# Patient Record
Sex: Female | Born: 1975
Health system: Southern US, Community
[De-identification: ages and names within clinical notes are randomized; demographics above are authoritative.]

## PROBLEM LIST (undated history)

## (undated) DIAGNOSIS — R55 Syncope and collapse: Secondary | ICD-10-CM

## (undated) DIAGNOSIS — K219 Gastro-esophageal reflux disease without esophagitis: Secondary | ICD-10-CM

## (undated) DIAGNOSIS — R51 Headache: Secondary | ICD-10-CM

## (undated) DIAGNOSIS — Z9889 Other specified postprocedural states: Secondary | ICD-10-CM

## (undated) DIAGNOSIS — S83006A Unspecified dislocation of unspecified patella, initial encounter: Secondary | ICD-10-CM

## (undated) DIAGNOSIS — R112 Nausea with vomiting, unspecified: Secondary | ICD-10-CM

## (undated) DIAGNOSIS — J45990 Exercise induced bronchospasm: Secondary | ICD-10-CM

## (undated) DIAGNOSIS — M329 Systemic lupus erythematosus, unspecified: Secondary | ICD-10-CM

## (undated) DIAGNOSIS — D649 Anemia, unspecified: Secondary | ICD-10-CM

## (undated) DIAGNOSIS — E785 Hyperlipidemia, unspecified: Secondary | ICD-10-CM

## (undated) DIAGNOSIS — T7840XA Allergy, unspecified, initial encounter: Secondary | ICD-10-CM

## (undated) HISTORY — DX: Exercise induced bronchospasm: J45.990

## (undated) HISTORY — DX: Gastro-esophageal reflux disease without esophagitis: K21.9

## (undated) HISTORY — DX: Hyperlipidemia, unspecified: E78.5

## (undated) HISTORY — DX: Unspecified dislocation of unspecified patella, initial encounter: S83.006A

## (undated) HISTORY — DX: Anemia, unspecified: D64.9

## (undated) HISTORY — DX: Allergy, unspecified, initial encounter: T78.40XA

## (undated) HISTORY — PX: ABDOMINAL HYSTERECTOMY: SHX81

## (undated) HISTORY — DX: Syncope and collapse: R55

---
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
## 1996-01-30 HISTORY — PX: TUMOR REMOVAL: SHX12

## 1997-01-29 DIAGNOSIS — F909 Attention-deficit hyperactivity disorder, unspecified type: Secondary | ICD-10-CM

## 1997-01-29 DIAGNOSIS — F419 Anxiety disorder, unspecified: Secondary | ICD-10-CM

## 1997-01-29 HISTORY — DX: Attention-deficit hyperactivity disorder, unspecified type: F90.9

## 1997-01-29 HISTORY — DX: Anxiety disorder, unspecified: F41.9

## 1998-01-29 DIAGNOSIS — R51 Headache: Secondary | ICD-10-CM

## 1998-01-29 HISTORY — DX: Headache: R51

## 2001-01-29 DIAGNOSIS — IMO0002 Reserved for concepts with insufficient information to code with codable children: Secondary | ICD-10-CM

## 2001-01-29 DIAGNOSIS — M329 Systemic lupus erythematosus, unspecified: Secondary | ICD-10-CM

## 2001-01-29 DIAGNOSIS — K529 Noninfective gastroenteritis and colitis, unspecified: Secondary | ICD-10-CM

## 2001-01-29 HISTORY — DX: Reserved for concepts with insufficient information to code with codable children: IMO0002

## 2001-01-29 HISTORY — DX: Noninfective gastroenteritis and colitis, unspecified: K52.9

## 2001-01-29 HISTORY — DX: Systemic lupus erythematosus, unspecified: M32.9

## 2001-09-15 ENCOUNTER — Encounter: Payer: Self-pay | Admitting: Obstetrics

## 2001-09-15 ENCOUNTER — Ambulatory Visit (HOSPITAL_COMMUNITY): Admission: RE | Admit: 2001-09-15 | Discharge: 2001-09-15 | Payer: Self-pay | Admitting: Obstetrics

## 2001-09-19 ENCOUNTER — Ambulatory Visit (HOSPITAL_COMMUNITY): Admission: RE | Admit: 2001-09-19 | Discharge: 2001-09-19 | Payer: Self-pay | Admitting: Obstetrics

## 2002-01-29 DIAGNOSIS — K589 Irritable bowel syndrome without diarrhea: Secondary | ICD-10-CM

## 2002-01-29 HISTORY — PX: TUBAL LIGATION: SHX77

## 2002-01-29 HISTORY — DX: Irritable bowel syndrome, unspecified: K58.9

## 2002-05-22 ENCOUNTER — Ambulatory Visit (HOSPITAL_COMMUNITY): Admission: RE | Admit: 2002-05-22 | Discharge: 2002-05-22 | Payer: Self-pay | Admitting: Gastroenterology

## 2002-06-12 ENCOUNTER — Ambulatory Visit (HOSPITAL_COMMUNITY): Admission: RE | Admit: 2002-06-12 | Discharge: 2002-06-12 | Payer: Self-pay | Admitting: Obstetrics & Gynecology

## 2002-06-12 ENCOUNTER — Encounter: Payer: Self-pay | Admitting: Obstetrics & Gynecology

## 2002-10-16 ENCOUNTER — Ambulatory Visit (HOSPITAL_COMMUNITY): Admission: RE | Admit: 2002-10-16 | Discharge: 2002-10-16 | Payer: Self-pay | Admitting: Obstetrics & Gynecology

## 2002-10-16 ENCOUNTER — Encounter: Payer: Self-pay | Admitting: Obstetrics & Gynecology

## 2002-12-17 ENCOUNTER — Inpatient Hospital Stay (HOSPITAL_COMMUNITY): Admission: AD | Admit: 2002-12-17 | Discharge: 2002-12-17 | Payer: Self-pay | Admitting: Obstetrics

## 2002-12-21 ENCOUNTER — Inpatient Hospital Stay (HOSPITAL_COMMUNITY): Admission: AD | Admit: 2002-12-21 | Discharge: 2002-12-23 | Payer: Self-pay | Admitting: Obstetrics

## 2003-01-06 ENCOUNTER — Ambulatory Visit (HOSPITAL_COMMUNITY): Admission: RE | Admit: 2003-01-06 | Discharge: 2003-01-06 | Payer: Self-pay | Admitting: Obstetrics & Gynecology

## 2003-01-12 ENCOUNTER — Ambulatory Visit (HOSPITAL_COMMUNITY): Admission: RE | Admit: 2003-01-12 | Discharge: 2003-01-12 | Payer: Self-pay | Admitting: Obstetrics & Gynecology

## 2003-01-17 ENCOUNTER — Inpatient Hospital Stay (HOSPITAL_COMMUNITY): Admission: AD | Admit: 2003-01-17 | Discharge: 2003-01-19 | Payer: Self-pay | Admitting: Obstetrics

## 2005-01-29 HISTORY — PX: DILATION AND CURETTAGE OF UTERUS: SHX78

## 2005-09-14 ENCOUNTER — Ambulatory Visit (HOSPITAL_COMMUNITY): Admission: RE | Admit: 2005-09-14 | Discharge: 2005-09-14 | Payer: Self-pay | Admitting: Obstetrics and Gynecology

## 2010-03-30 ENCOUNTER — Ambulatory Visit: Payer: Self-pay | Admitting: Sports Medicine

## 2010-04-04 ENCOUNTER — Ambulatory Visit: Payer: Self-pay | Admitting: Sports Medicine

## 2010-04-09 ENCOUNTER — Ambulatory Visit (INDEPENDENT_AMBULATORY_CARE_PROVIDER_SITE_OTHER)

## 2010-04-09 ENCOUNTER — Inpatient Hospital Stay (INDEPENDENT_AMBULATORY_CARE_PROVIDER_SITE_OTHER)
Admission: RE | Admit: 2010-04-09 | Discharge: 2010-04-09 | Disposition: A | Discharge status: Home / Self Care | Source: Ambulatory Visit | Attending: Emergency Medicine | Admitting: Emergency Medicine

## 2010-04-09 DIAGNOSIS — S81809A Unspecified open wound, unspecified lower leg, initial encounter: Secondary | ICD-10-CM

## 2010-04-09 DIAGNOSIS — S81009A Unspecified open wound, unspecified knee, initial encounter: Secondary | ICD-10-CM

## 2010-04-09 DIAGNOSIS — S91009A Unspecified open wound, unspecified ankle, initial encounter: Secondary | ICD-10-CM

## 2010-04-09 DIAGNOSIS — S8010XA Contusion of unspecified lower leg, initial encounter: Secondary | ICD-10-CM

## 2010-04-20 ENCOUNTER — Ambulatory Visit (INDEPENDENT_AMBULATORY_CARE_PROVIDER_SITE_OTHER): Admitting: Sports Medicine

## 2010-04-20 ENCOUNTER — Encounter: Payer: Self-pay | Admitting: Sports Medicine

## 2010-04-20 ENCOUNTER — Encounter: Payer: Self-pay | Admitting: Family Medicine

## 2010-04-20 DIAGNOSIS — M222X9 Patellofemoral disorders, unspecified knee: Secondary | ICD-10-CM | POA: Insufficient documentation

## 2010-04-20 DIAGNOSIS — R269 Unspecified abnormalities of gait and mobility: Secondary | ICD-10-CM

## 2010-04-20 DIAGNOSIS — M25562 Pain in left knee: Secondary | ICD-10-CM | POA: Insufficient documentation

## 2010-04-20 DIAGNOSIS — M25569 Pain in unspecified knee: Secondary | ICD-10-CM

## 2010-04-20 NOTE — Assessment & Plan Note (Signed)
-   Fitted with sports insoles today which were comfortable in office today & gait was more neutral - Wear insoles in all shoes

## 2010-04-20 NOTE — Assessment & Plan Note (Signed)
-   Fitted with sports insoles to correct gait abnormality which should help - Fitted with body helix slingshot which was comfortable - wear with activity - Start knee HEP - quad sets, straight leg raises, short-arch leg extensions - Start hip HEP - hip adduction/abduction exercises, standing rotations - Start using stationary bike - May resume running, but start slow - no more than 2-miles/day and increase gradually - f/u 6-weeks, call with any questions or concerns

## 2010-04-20 NOTE — Assessment & Plan Note (Signed)
-   secondary to patellofemoral syndrome - plan as noted above
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
## 2010-05-30 ENCOUNTER — Ambulatory Visit (INDEPENDENT_AMBULATORY_CARE_PROVIDER_SITE_OTHER): Admitting: Sports Medicine

## 2010-05-30 DIAGNOSIS — M25569 Pain in unspecified knee: Secondary | ICD-10-CM

## 2010-05-30 DIAGNOSIS — M25562 Pain in left knee: Secondary | ICD-10-CM

## 2010-05-30 DIAGNOSIS — M222X9 Patellofemoral disorders, unspecified knee: Secondary | ICD-10-CM

## 2010-05-30 DIAGNOSIS — R269 Unspecified abnormalities of gait and mobility: Secondary | ICD-10-CM

## 2010-05-30 NOTE — Assessment & Plan Note (Signed)
Mostly resolved and will keep up exercises and strap

## 2010-05-30 NOTE — Patient Instructions (Signed)
Continue biking twice weekly Continue current hip/knee exercises three times weekly 1 set of 15  Do step up, step down, lateral step ups, cross over step ups holding dumbells 10 - 15 reps, 2-3 sets Increase mileage by 1/2 mile weekly - once you have reached 5 miles comfortably - ok to add a long once per week  If you are still significantly improving you do not need to follow up We will see you as needed

## 2010-05-30 NOTE — Assessment & Plan Note (Signed)
Cont using sports insoles as these seem to help  Recheck by [place] Prn

## 2010-05-30 NOTE — Assessment & Plan Note (Signed)
Given more advanced exercises w step ups  gradully ease back into running  Increase by 1/2 mile per run Has been doing 2 to 3 miles without too much pain

## 2010-05-30 NOTE — Progress Notes (Signed)
  Subjective:    Patient ID: [name], female    DOB: 07/01/1975, 35 y.o.   MRN: [id number]  HPI  Pt presents to clinic for f/u of Lt knee patellofemoral pain which she reports has improved 50%.  Rt knee now has no pain.  She now has no pain going downstairs. Has slight discomfort with biking and running but not as intense.  Has been compliant with HEP, using sling shot body helix, and sports insoles.  Biking 2-4 times per week 5-6 miles.  Has not resumed running yet.   Review of Systems     Objective:   Physical Exam     Full ROM no crepitation bilat Good patellar tracking bilat Neg McMurray's bilat VMO definition good bilat Hip abduction strong bilat Strength looks improved to VMOs and to hip abductors  Running gait looks comfortable    Assessment & Plan:

## 2010-07-04 ENCOUNTER — Other Ambulatory Visit: Payer: Self-pay | Admitting: Certified Nurse Midwife

## 2011-01-01 ENCOUNTER — Other Ambulatory Visit: Payer: Self-pay | Admitting: Obstetrics and Gynecology

## 2011-01-02 ENCOUNTER — Encounter (HOSPITAL_COMMUNITY): Payer: Self-pay | Admitting: Pharmacist

## 2011-01-05 ENCOUNTER — Encounter (HOSPITAL_COMMUNITY): Payer: Self-pay

## 2011-01-05 ENCOUNTER — Encounter (HOSPITAL_COMMUNITY)
Admission: RE | Admit: 2011-01-05 | Discharge: 2011-01-05 | Disposition: A | Discharge status: Home / Self Care | Source: Ambulatory Visit | Attending: Obstetrics and Gynecology | Admitting: Obstetrics and Gynecology

## 2011-01-05 HISTORY — DX: Systemic lupus erythematosus, unspecified: M32.9

## 2011-01-05 HISTORY — DX: Nausea with vomiting, unspecified: R11.2

## 2011-01-05 HISTORY — DX: Headache: R51

## 2011-01-05 HISTORY — DX: Nausea with vomiting, unspecified: Z98.890

## 2011-01-05 LAB — CBC
HCT: 38.9 % (ref 36.0–46.0)
Hemoglobin: 13.2 g/dL (ref 12.0–15.0)
MCH: 30.6 pg (ref 26.0–34.0)
MCHC: 33.9 g/dL (ref 30.0–36.0)
MCV: 90.3 fL (ref 78.0–100.0)
Platelets: 242 10*3/uL (ref 150–400)
RBC: 4.31 MIL/uL (ref 3.87–5.11)
RDW: 12 % (ref 11.5–15.5)
WBC: 5 10*3/uL (ref 4.0–10.5)

## 2011-01-05 LAB — SURGICAL PCR SCREEN: MRSA, PCR: NEGATIVE

## 2011-01-05 NOTE — Patient Instructions (Addendum)
   Your procedure is scheduled on: Thursday December 13th  Enter through the Main Entrance of [facility] at:6am Pick up the phone at the desk and dial [phone] and inform us of your arrival.  Please call this number if you have any problems the morning of surgery: [phone]  Remember: Do not eat food after midnight: Wednesday Do not drink clear liquids after: midnight Wednesday Take these medicines the morning of surgery with a SIP OF WATER: none  Do not wear jewelry, make-up, or FINGER nail polish Do not wear lotions, powders, perfumes or deodorant. Do not shave 48 hours prior to surgery. Do not bring valuables to the hospital.  Leave suitcase in the car. After Surgery it may be brought to your room. For patients being admitted to the hospital, checkout time is 11:00am the day of discharge.  Patients discharged on the day of surgery will not be allowed to drive home.     Remember to use your hibiclens as instructed.Please shower with 1/2 bottle the evening before your surgery and the other 1/2 bottle the morning of surgery.

## 2011-01-10 NOTE — H&P (Signed)
NAME:  Current, [name]                  ACCOUNT NO.:  [id number]  MEDICAL RECORD NO.:  [id number]  LOCATION:  PERIO                         FACILITY:  [facility]  PHYSICIAN:  Lenoard Aden, M.D.DATE OF BIRTH:  [birth year]  DATE OF ADMISSION:  08/25/2010 DATE OF DISCHARGE:                             HISTORY & PHYSICAL   CHIEF COMPLAINT:  Menometrorrhagia, status post delayed failed ablation.  HISTORY OF PRESENT ILLNESS:  A 35 year old white female G3, P2, history of vaginal delivery x2, who presents now status post failed endometrial ablation in 2007 and is also status post tubal ligation for definitive therapy.  She has medications to include Maxalt p.r.n. for headache.  Allergies to VICODIN, WELLBUTRIN, and LATEX.  She is a nonsmoker, nondrinker.  She denies domestic or physical violence.  She has a family history of breast cancer, cancer, eye cancer, diabetes, lymphoma, heart disease.  PREGNANCY HISTORY:  As noted.  SURGICAL HISTORY:  Remarkable for endometrial ablation in 2007, tubal ligation in 2004, D and C in 2003, ulnar nerve small tumor removed in 1998, vaginal delivery as noted x2.  PHYSICAL EXAMINATION:  VITAL SIGNS:  Blood pressure 104/62, height of 5 feet 5 inches, and weight of 138 pounds. HEENT:  Normal. NECK:  Supple.  Full range of motion. LUNGS:  Clear. HEART:  Regular rate and rhythm. ABDOMEN:  Soft, scaphoid, nontender.  No CVA tenderness. EXTREMITIES:  There are no cords. NEUROLOGIC:  Nonfocal. SKIN:  Intact. PELVIC:  Reveals a small retroflexed uterus.  IMPRESSION:  Failed endometrial ablation with persistent menometrorrhagia.  She had a history of tubal ligation.  PLAN:  Proceed with da Vinci-assisted total laparoscopic hysterectomy, bilateral salpingectomy.  Risks of anesthesia, infection, bleeding, injury to abdominal organs, need for repair, delayed versus immediate complications to include bowel and bladder was noted.  The patient acknowledges  and wishes to proceed.     Lenoard Aden, M.D.     RJT/MEDQ  D:  01/10/2011  T:  01/10/2011  Job:  [id number]

## 2011-01-11 ENCOUNTER — Encounter (HOSPITAL_COMMUNITY)
Admission: RE | Disposition: A | Discharge status: Home / Self Care | Payer: Self-pay | Source: Ambulatory Visit | Attending: Obstetrics and Gynecology

## 2011-01-11 ENCOUNTER — Encounter (HOSPITAL_COMMUNITY): Payer: Self-pay | Admitting: Anesthesiology

## 2011-01-11 ENCOUNTER — Ambulatory Visit (HOSPITAL_COMMUNITY): Admitting: Anesthesiology

## 2011-01-11 ENCOUNTER — Ambulatory Visit (HOSPITAL_COMMUNITY)
Admission: RE | Admit: 2011-01-11 | Discharge: 2011-01-11 | Disposition: A | Discharge status: Home / Self Care | Source: Ambulatory Visit | Attending: Obstetrics and Gynecology | Admitting: Obstetrics and Gynecology

## 2011-01-11 ENCOUNTER — Other Ambulatory Visit: Payer: Self-pay | Admitting: Obstetrics and Gynecology

## 2011-01-11 ENCOUNTER — Encounter (HOSPITAL_COMMUNITY): Payer: Self-pay | Admitting: *Deleted

## 2011-01-11 DIAGNOSIS — Z01812 Encounter for preprocedural laboratory examination: Secondary | ICD-10-CM | POA: Insufficient documentation

## 2011-01-11 DIAGNOSIS — N838 Other noninflammatory disorders of ovary, fallopian tube and broad ligament: Secondary | ICD-10-CM | POA: Insufficient documentation

## 2011-01-11 DIAGNOSIS — N920 Excessive and frequent menstruation with regular cycle: Secondary | ICD-10-CM | POA: Insufficient documentation

## 2011-01-11 DIAGNOSIS — Z01818 Encounter for other preprocedural examination: Secondary | ICD-10-CM | POA: Insufficient documentation

## 2011-01-11 LAB — HCG, SERUM, QUALITATIVE: Preg, Serum: NEGATIVE

## 2011-01-11 SURGERY — ROBOTIC ASSISTED TOTAL HYSTERECTOMY
Anesthesia: General | Site: Abdomen | Wound class: Clean Contaminated

## 2011-01-11 MED ORDER — ROCURONIUM BROMIDE 50 MG/5ML IV SOLN
INTRAVENOUS | Status: AC
  Filled 2011-01-11: qty 1

## 2011-01-11 MED ORDER — DEXAMETHASONE SODIUM PHOSPHATE 4 MG/ML IJ SOLN
INTRAMUSCULAR | Status: DC | PRN
  Administered 2011-01-11: 10 mg via INTRAVENOUS

## 2011-01-11 MED ORDER — MEPERIDINE HCL 25 MG/ML IJ SOLN
INTRAMUSCULAR | Status: AC
  Filled 2011-01-11: qty 1

## 2011-01-11 MED ORDER — FENTANYL CITRATE 0.05 MG/ML IJ SOLN
INTRAMUSCULAR | Status: AC
  Filled 2011-01-11: qty 2

## 2011-01-11 MED ORDER — GLYCOPYRROLATE 0.2 MG/ML IJ SOLN
INTRAMUSCULAR | Status: DC | PRN
  Administered 2011-01-11: 0.2 mg via INTRAVENOUS
  Administered 2011-01-11: .6 mg via INTRAVENOUS

## 2011-01-11 MED ORDER — LACTATED RINGERS IV SOLN
INTRAVENOUS | Status: DC
  Administered 2011-01-11 (×4): via INTRAVENOUS

## 2011-01-11 MED ORDER — MEPERIDINE HCL 25 MG/ML IJ SOLN
INTRAMUSCULAR | Status: DC | PRN
  Administered 2011-01-11: 25 mg via INTRAVENOUS

## 2011-01-11 MED ORDER — ROCURONIUM BROMIDE 100 MG/10ML IV SOLN
INTRAVENOUS | Status: DC | PRN
  Administered 2011-01-11: 50 mg via INTRAVENOUS
  Administered 2011-01-11: 20 mg via INTRAVENOUS

## 2011-01-11 MED ORDER — ACETAMINOPHEN 325 MG PO TABS: 325.0000 mg | ORAL_TABLET | ORAL | Status: DC | PRN

## 2011-01-11 MED ORDER — SCOPOLAMINE 1 MG/3DAYS TD PT72
1.0000 | MEDICATED_PATCH | Freq: Once | TRANSDERMAL | Status: DC | PRN
  Administered 2011-01-11: 1.5 mg via TRANSDERMAL

## 2011-01-11 MED ORDER — NEOSTIGMINE METHYLSULFATE 1 MG/ML IJ SOLN
INTRAMUSCULAR | Status: DC | PRN
  Administered 2011-01-11: 3 mg via INTRAVENOUS

## 2011-01-11 MED ORDER — KETOROLAC TROMETHAMINE 30 MG/ML IJ SOLN
INTRAMUSCULAR | Status: DC | PRN
  Administered 2011-01-11: 30 mg via INTRAVENOUS

## 2011-01-11 MED ORDER — PROPOFOL 10 MG/ML IV EMUL
INTRAVENOUS | Status: AC
  Filled 2011-01-11: qty 20

## 2011-01-11 MED ORDER — PROPOFOL 10 MG/ML IV EMUL
INTRAVENOUS | Status: DC | PRN
  Administered 2011-01-11: 150 mg via INTRAVENOUS

## 2011-01-11 MED ORDER — FENTANYL CITRATE 0.05 MG/ML IJ SOLN
25.0000 ug | INTRAMUSCULAR | Status: DC | PRN
  Administered 2011-01-11 (×2): 50 ug via INTRAVENOUS

## 2011-01-11 MED ORDER — MIDAZOLAM HCL 2 MG/2ML IJ SOLN
INTRAMUSCULAR | Status: AC
  Filled 2011-01-11: qty 2

## 2011-01-11 MED ORDER — LACTATED RINGERS IR SOLN
Status: DC | PRN
  Administered 2011-01-11: 3000 mL

## 2011-01-11 MED ORDER — PROMETHAZINE HCL 25 MG/ML IJ SOLN: 6.2500 mg | INTRAMUSCULAR | Status: DC | PRN

## 2011-01-11 MED ORDER — LIDOCAINE HCL (CARDIAC) 20 MG/ML IV SOLN
INTRAVENOUS | Status: DC | PRN
  Administered 2011-01-11: 50 mg via INTRAVENOUS

## 2011-01-11 MED ORDER — LIDOCAINE HCL (CARDIAC) 20 MG/ML IV SOLN
INTRAVENOUS | Status: AC
  Filled 2011-01-11: qty 5

## 2011-01-11 MED ORDER — FENTANYL CITRATE 0.05 MG/ML IJ SOLN
INTRAMUSCULAR | Status: AC
  Filled 2011-01-11: qty 5

## 2011-01-11 MED ORDER — OXYCODONE-ACETAMINOPHEN 5-325 MG PO TABS
1.0000 | ORAL_TABLET | ORAL | Status: DC | PRN
  Administered 2011-01-11: 2 via ORAL
  Filled 2011-01-11 (×2): qty 2

## 2011-01-11 MED ORDER — ZOLPIDEM TARTRATE 5 MG PO TABS: 5.0000 mg | ORAL_TABLET | Freq: Every evening | ORAL | Status: DC | PRN

## 2011-01-11 MED ORDER — CEFAZOLIN SODIUM 1-5 GM-% IV SOLN
1.0000 g | INTRAVENOUS | Status: AC
  Administered 2011-01-11: 1 g via INTRAVENOUS

## 2011-01-11 MED ORDER — FENTANYL CITRATE 0.05 MG/ML IJ SOLN
INTRAMUSCULAR | Status: AC
  Administered 2011-01-11: 50 ug via INTRAVENOUS
  Filled 2011-01-11: qty 2

## 2011-01-11 MED ORDER — ONDANSETRON HCL 4 MG/2ML IJ SOLN
INTRAMUSCULAR | Status: AC
  Filled 2011-01-11: qty 2

## 2011-01-11 MED ORDER — MIDAZOLAM HCL 5 MG/5ML IJ SOLN
INTRAMUSCULAR | Status: DC | PRN
  Administered 2011-01-11: 2 mg via INTRAVENOUS

## 2011-01-11 MED ORDER — GLYCOPYRROLATE 0.2 MG/ML IJ SOLN
INTRAMUSCULAR | Status: AC
  Filled 2011-01-11: qty 2

## 2011-01-11 MED ORDER — OXYCODONE-ACETAMINOPHEN 5-325 MG PO TABS: 1.0000 | ORAL_TABLET | ORAL | Status: AC | PRN

## 2011-01-11 MED ORDER — NEOSTIGMINE METHYLSULFATE 1 MG/ML IJ SOLN
INTRAMUSCULAR | Status: AC
  Filled 2011-01-11: qty 10

## 2011-01-11 MED ORDER — DEXTROSE-NACL 5-0.45 % IV SOLN: INTRAVENOUS | Status: DC

## 2011-01-11 MED ORDER — KETOROLAC TROMETHAMINE 30 MG/ML IJ SOLN
INTRAMUSCULAR | Status: AC
  Filled 2011-01-11: qty 1

## 2011-01-11 MED ORDER — ONDANSETRON HCL 4 MG/2ML IJ SOLN
INTRAMUSCULAR | Status: DC | PRN
  Administered 2011-01-11: 4 mg via INTRAVENOUS

## 2011-01-11 MED ORDER — FENTANYL CITRATE 0.05 MG/ML IJ SOLN
INTRAMUSCULAR | Status: DC | PRN
  Administered 2011-01-11: 50 ug via INTRAVENOUS
  Administered 2011-01-11: 100 ug via INTRAVENOUS
  Administered 2011-01-11: 150 ug via INTRAVENOUS
  Administered 2011-01-11: 50 ug via INTRAVENOUS

## 2011-01-11 MED ORDER — BUPIVACAINE HCL (PF) 0.25 % IJ SOLN
INTRAMUSCULAR | Status: DC | PRN
  Administered 2011-01-11: 14 mL

## 2011-01-11 MED ORDER — KETOROLAC TROMETHAMINE 30 MG/ML IJ SOLN: 15.0000 mg | Freq: Once | INTRAMUSCULAR | Status: DC | PRN

## 2011-01-11 SURGICAL SUPPLY — 74 items
ADH SKN CLS APL DERMABOND .7 (GAUZE/BANDAGES/DRESSINGS) ×8
BAG URINE DRAINAGE (UROLOGICAL SUPPLIES) ×3 IMPLANT
BARRIER ADHS 3X4 INTERCEED (GAUZE/BANDAGES/DRESSINGS) ×3 IMPLANT
BLADELESS LONG 8MM (BLADE) IMPLANT
BRR ADH 4X3 ABS CNTRL BYND (GAUZE/BANDAGES/DRESSINGS) ×2
CABLE HIGH FREQUENCY MONO STRZ (ELECTRODE) ×3 IMPLANT
CATH FOLEY 3WAY  5CC 16FR (CATHETERS) ×1
CATH FOLEY 3WAY 5CC 16FR (CATHETERS) ×2 IMPLANT
CHLORAPREP W/TINT 26ML (MISCELLANEOUS) ×3 IMPLANT
CLOTH BEACON ORANGE TIMEOUT ST (SAFETY) ×3 IMPLANT
CONT PATH 16OZ SNAP LID 3702 (MISCELLANEOUS) ×3 IMPLANT
COVER MAYO STAND STRL (DRAPES) ×3 IMPLANT
COVER TABLE BACK 60X90 (DRAPES) ×6 IMPLANT
COVER TIP SHEARS 8 DVNC (MISCELLANEOUS) ×2 IMPLANT
COVER TIP SHEARS 8MM DA VINCI (MISCELLANEOUS) ×1
DECANTER SPIKE VIAL GLASS SM (MISCELLANEOUS) ×3 IMPLANT
DERMABOND ADVANCED (GAUZE/BANDAGES/DRESSINGS) ×4
DERMABOND ADVANCED .7 DNX12 (GAUZE/BANDAGES/DRESSINGS) ×2 IMPLANT
DRAPE HUG U DISPOSABLE (DRAPE) ×3 IMPLANT
DRAPE HYSTEROSCOPY (DRAPE) ×1 IMPLANT
DRAPE LG THREE QUARTER DISP (DRAPES) ×6 IMPLANT
DRAPE MONITOR DA VINCI (DRAPE) IMPLANT
DRAPE WARM FLUID 44X44 (DRAPE) ×3 IMPLANT
ELECT REM PT RETURN 9FT ADLT (ELECTROSURGICAL) ×3
ELECTRODE REM PT RTRN 9FT ADLT (ELECTROSURGICAL) ×2 IMPLANT
EVACUATOR SMOKE 8.L (FILTER) ×3 IMPLANT
GAUZE VASELINE 3X9 (GAUZE/BANDAGES/DRESSINGS) IMPLANT
GLOVE BIO SURGEON STRL SZ7.5 (GLOVE) ×9 IMPLANT
GOWN PREVENTION PLUS LG XLONG (DISPOSABLE) ×9 IMPLANT
GOWN PREVENTION PLUS XLARGE (GOWN DISPOSABLE) ×3 IMPLANT
GYRUS RUMI II 2.5CM BLUE (DISPOSABLE)
GYRUS RUMI II 3.5CM BLUE (DISPOSABLE)
GYRUS RUMI II 4.0CM BLUE (DISPOSABLE)
KIT ACCESSORY DA VINCI DISP (KITS) ×1
KIT ACCESSORY DVNC DISP (KITS) ×2 IMPLANT
KIT DISP ACCESSORY 4 ARM (KITS) IMPLANT
NDL INSUFFLATION 14GA 120MM (NEEDLE) ×2 IMPLANT
NEEDLE INSUFFLATION 14GA 120MM (NEEDLE) ×3 IMPLANT
PACK LAVH (CUSTOM PROCEDURE TRAY) ×3 IMPLANT
PAD PREP 24X48 CUFFED NSTRL (MISCELLANEOUS) ×6 IMPLANT
PLUG CATH AND CAP STER (CATHETERS) ×3 IMPLANT
POSITIONER SURGICAL ARM (MISCELLANEOUS) ×6 IMPLANT
RUMI II 3.0CM BLUE KOH-EFFICIE (DISPOSABLE) ×1 IMPLANT
RUMI II GYRUS 2.5CM BLUE (DISPOSABLE) IMPLANT
RUMI II GYRUS 3.5CM BLUE (DISPOSABLE) IMPLANT
RUMI II GYRUS 4.0CM BLUE (DISPOSABLE) IMPLANT
SET IRRIG TUBING LAPAROSCOPIC (IRRIGATION / IRRIGATOR) ×3 IMPLANT
SOLUTION ELECTROLUBE (MISCELLANEOUS) ×3 IMPLANT
SPONGE LAP 18X18 X RAY DECT (DISPOSABLE) IMPLANT
SUT VIC AB 0 CT1 27 (SUTURE) ×6
SUT VIC AB 0 CT1 27XBRD ANBCTR (SUTURE) ×4 IMPLANT
SUT VIC AB 0 CT1 27XBRD ANTBC (SUTURE) IMPLANT
SUT VIC AB 0 CT2 27 (SUTURE) IMPLANT
SUT VICRYL 0 27 CT2 27 ABS (SUTURE) IMPLANT
SUT VICRYL 0 UR6 27IN ABS (SUTURE) ×3 IMPLANT
SUT VICRYL RAPIDE 4/0 PS 2 (SUTURE) ×6 IMPLANT
SYR 50ML LL SCALE MARK (SYRINGE) ×3 IMPLANT
SYRINGE 10CC LL (SYRINGE) ×3 IMPLANT
SYSTEM CONVERTIBLE TROCAR (TROCAR) IMPLANT
TIP UTERINE 5.1X6CM LAV DISP (MISCELLANEOUS) IMPLANT
TIP UTERINE 6.7X10CM GRN DISP (MISCELLANEOUS) IMPLANT
TIP UTERINE 6.7X6CM WHT DISP (MISCELLANEOUS) IMPLANT
TIP UTERINE 6.7X8CM BLUE DISP (MISCELLANEOUS) ×1 IMPLANT
TOWEL OR 17X24 6PK STRL BLUE (TOWEL DISPOSABLE) ×9 IMPLANT
TROCAR DISP BLADELESS 8 DVNC (TROCAR) ×2 IMPLANT
TROCAR DISP BLADELESS 8MM (TROCAR) ×1
TROCAR XCEL 12X100 BLDLESS (ENDOMECHANICALS) IMPLANT
TROCAR XCEL NON-BLD 5MMX100MML (ENDOMECHANICALS) ×3 IMPLANT
TROCAR Z-THREAD 12X150 (TROCAR) ×3 IMPLANT
TROCAR Z-THREAD BLADED 12X100M (TROCAR) IMPLANT
TROCAR Z-THREAD FIOS 12X100MM (TROCAR) IMPLANT
TUBING FILTER THERMOFLATOR (ELECTROSURGICAL) ×3 IMPLANT
WARMER LAPAROSCOPE (MISCELLANEOUS) ×3 IMPLANT
WATER STERILE IRR 1000ML POUR (IV SOLUTION) ×9 IMPLANT

## 2011-01-11 NOTE — Addendum Note (Signed)
Addendum  created 01/11/11 1503 by Pat Patrick   Modules edited:Notes Section

## 2011-01-11 NOTE — Anesthesia Postprocedure Evaluation (Signed)
  Anesthesia Post-op Note  Patient: [name]  Procedure(s) Performed:  ROBOTIC ASSISTED TOTAL HYSTERECTOMY; BILATERAL SALPINGECTOMY  Patient Location: PACU and Women's Unit  Anesthesia Type: General  Level of Consciousness: awake, alert  and oriented  Airway and Oxygen Therapy: Patient Spontanous Breathing  Post-op Pain: mild  Post-op Assessment: Post-op Vital signs reviewed and Patient's Cardiovascular Status Stable  Post-op Vital Signs: Reviewed and stable  Complications: No apparent anesthesia complications

## 2011-01-11 NOTE — Op Note (Signed)
01/11/2011  9:53 AM  PATIENT:  Tollie Eth  35 y.o. female  PRE-OPERATIVE DIAGNOSIS:  Menorrhagia Pelvic adhesions  POST-OPERATIVE DIAGNOSIS:  Menorrhagia  PROCEDURE:  Procedure(s): ROBOTIC ASSISTED TOTAL HYSTERECTOMY BILATERAL SALPINGECTOMY LYSIS OF ADHESIONS  SURGEON:  Surgeon(s): Lenoard Aden, MD Serita Kyle, MD  ASSISTANTS: Cherly Hensen  ANESTHESIA:   local and general  ESTIMATED BLOOD LOSS: * No blood loss amount entered *   DRAINS: Urinary Catheter (Foley)   LOCAL MEDICATIONS USED:  MARCAINE 10CC  SPECIMEN:  Source of Specimen:  uterus , cervix and bilateral fallopian tubes  DISPOSITION OF SPECIMEN:  PATHOLOGY  COUNTS:  YES  DICTATION #: [id number]  PLAN OF CARE: DC home today  PATIENT DISPOSITION:  PACU - hemodynamically stable.

## 2011-01-11 NOTE — Progress Notes (Signed)
Patient ID: [name], female   DOB: September 20, 1975, 35 y.o.   MRN: [id number] Consent done. Pt examined Update done. No changes noted.

## 2011-01-11 NOTE — Anesthesia Postprocedure Evaluation (Signed)
  Anesthesia Post-op Note  Patient: [name]  Procedure(s) Performed:  ROBOTIC ASSISTED TOTAL HYSTERECTOMY; BILATERAL SALPINGECTOMY  Patient Location: PACU  Anesthesia Type: General  Level of Consciousness: awake, alert  and oriented  Airway and Oxygen Therapy: Patient Spontanous Breathing  Post-op Pain: none  Post-op Assessment: Post-op Vital signs reviewed, Patient's Cardiovascular Status Stable, Respiratory Function Stable, Patent Airway, No signs of Nausea or vomiting and Pain level controlled  Post-op Vital Signs: Reviewed and stable  Complications: No apparent anesthesia complications

## 2011-01-11 NOTE — Anesthesia Procedure Notes (Signed)
Procedure Name: Intubation Date/Time: 01/11/2011 7:35 AM Performed by: Madison Hickman Pre-anesthesia Checklist: Patient identified, Emergency Drugs available, Suction available, Timeout performed and Patient being monitored Patient Re-evaluated:Patient Re-evaluated prior to inductionOxygen Delivery Method: Circle System Utilized Preoxygenation: Pre-oxygenation with 100% oxygen Intubation Type: IV induction Ventilation: Mask ventilation without difficulty Laryngoscope Size: Mac and 3 Grade View: Grade II Tube type: Oral Tube size: 7.0 mm Number of attempts: 1 Airway Equipment and Method: stylet Placement Confirmation: ETT inserted through vocal cords under direct vision,  positive ETCO2 and breath sounds checked- equal and bilateral Secured at: 20 cm Tube secured with: Tape Dental Injury: Teeth and Oropharynx as per pre-operative assessment

## 2011-01-11 NOTE — Transfer of Care (Signed)
Immediate Anesthesia Transfer of Care Note  Patient: [name]  Procedure(s) Performed:  ROBOTIC ASSISTED TOTAL HYSTERECTOMY; BILATERAL SALPINGECTOMY  Patient Location: PACU  Anesthesia Type: General  Level of Consciousness: awake, alert  and oriented  Airway & Oxygen Therapy: Patient Spontanous Breathing and Patient connected to nasal cannula oxygen  Post-op Assessment: Report given to PACU RN and Post -op Vital signs reviewed and stable  Post vital signs: Reviewed and stable  Complications: No apparent anesthesia complications

## 2011-01-11 NOTE — Anesthesia Preprocedure Evaluation (Addendum)
Anesthesia Evaluation  Patient identified by MRN, date of birth, ID band Patient awake    Reviewed: Allergy & Precautions, H&P , Patient's Chart, lab work & pertinent test results, reviewed documented beta blocker date and time   History of Anesthesia Complications (+) PONV and Family history of anesthesia reaction  Airway Mallampati: II TM Distance: >3 FB Neck ROM: full    Dental No notable dental hx.    Pulmonary neg pulmonary ROS,  clear to auscultation  Pulmonary exam normal       Cardiovascular Exercise Tolerance: Good neg cardio ROS regular Normal    Neuro/Psych  Headaches, Negative Neurological ROS  Negative Psych ROS   GI/Hepatic negative GI ROS, Neg liver ROS,   Endo/Other  Negative Endocrine ROS  Renal/GU negative Renal ROS     Musculoskeletal   Abdominal   Peds  Hematology negative hematology ROS (+)   Anesthesia Other Findings lupus  Reproductive/Obstetrics negative OB ROS                           Anesthesia Physical Anesthesia Plan  ASA: II  Anesthesia Plan: General ETT   Post-op Pain Management:    Induction:   Airway Management Planned:   Additional Equipment:   Intra-op Plan:   Post-operative Plan:   Informed Consent: I have reviewed the patients History and Physical, chart, labs and discussed the procedure including the risks, benefits and alternatives for the proposed anesthesia with the patient or authorized representative who has indicated his/her understanding and acceptance.   Dental Advisory Given  Plan Discussed with: CRNA and Surgeon  Anesthesia Plan Comments:        severe nausea with every anesthetic.  Scopolamine patch placed.   Anesthesia Quick Evaluation

## 2011-01-12 NOTE — Op Note (Signed)
NAME:  [name], [name]                  ACCOUNT NO.:  [id number]  MEDICAL RECORD NO.:  [id number]  LOCATION:  9305                          FACILITY:  [facility]  PHYSICIAN:  Lenoard Aden, M.D.DATE OF BIRTH:  [birth year]  DATE OF PROCEDURE:  01/11/2011 DATE OF DISCHARGE:  01/11/2011                              OPERATIVE REPORT   DESCRIPTION OF PROCEDURE:  After being apprised of the risks of anesthesia, infection, bleeding, injury to abdominal organs, possible need for repair, injury to bowel and bladder with possible need for repair, the patient was brought to the operating room and she was administered general anesthetic without complications, prepped and draped in usual sterile fashion.  Foley catheter was placed.  Roomy retractor was placed per vagina during the placement of the Roomy retractor.  Please note that due to previous ablation, purposely the uterus was perforated.  At this time after placement of the Roomy retractor, attention was turned to the abdominal portion of the procedure whereby an infraumbilical incision was made with a scalpel. Veress needle was placed, opening pressure -2, 6 L of CO2 insufflated without difficulty.  A 10-mm trocar was placed.  Visualization reveals atraumatic trocar entry, normal bowel, normal liver gallbladder area, normal appendiceal area, anterior cul-de-sac with adhesions to the bladder flap.  In addition, there are adhesions of the sigmoid mesentery to the left adnexa.  After placement of the robotic ports on the right and left under direct visualization and a 5-mm port on the left, the robot was docked in a standard fashion.  The PK forceps and the scissors were placed through the robotic ports and attention was turned to the console whereby these adhesions along the left adnexa were lysed sharply with the Endo Shears and atraumatically both ureters were identified bilaterally.  The adhesions of the bladder flap were also lysed  without difficulty.  At this time after identifying the ureters, the avascular space cephalad to the left round ligament was opened and the retroperitoneal space was entered.  The left ovarian ligament was cauterized and divided the left mesosalpinx, cauterized and divided and the left tube was detached distally down to the level of its attachment to the uterus.  The retroperitoneal space was then further entered dissecting the ureter off the medial leaf of the peritoneum and pushing the ureter further down towards the pelvic sidewall.  At this time, the round ligament was divided and ligated and the broad ligament was entered skeletonizing the uterine vessels.  The bladder flap was developed sharply and the uterine vessels on the left were skeletonized and cauterized, but not divided.  Similarly on the right side, the right mesosalpinx was divided using electrocautery to the point where it attaches to the uterus.  The avascular space cephalad to the round ligament was entered.  The ovarian ligament was then directly clamped and divided.  The retroperitoneal space was entered.  The right ureter was also mobilized off the medial leaf of the peritoneum.  The round ligament was divided and the broad ligament was entered skeletonizing the uterine vessels.  After further development of the bladder flap, the uterine vessels on the right were clamped and ligated.  After achieving good hemostasis and blanching of the uterus, the Roomy cup was then scored 360 degrees using monopolar cautery.  This specimen was then detached and retracted into the vagina.  The vagina was then found to be hemostatic.  The vagina was closed using a running suture of the GS1 9- inch V-Loc suture in a continuous fashion without complication.  Urine was clear.  Irrigation was accomplished.  All instruments were then removed under direct visualization.  The robot was undocked.  CO2 was released. All specimens were  removed from the vagina.  Incisions were closed using 0 Vicryl, 4-0 Vicryl, and Dermabond.  The patient tolerated the procedure well, was awakened and transferred to recovery in good condition.     Lenoard Aden, M.D.     RJT/MEDQ  D:  01/11/2011  T:  01/12/2011  Job:  [id number]

## 2016-07-08 ENCOUNTER — Other Ambulatory Visit (HOSPITAL_COMMUNITY): Admission: RE | Admit: 2016-07-08

## 2016-07-08 ENCOUNTER — Other Ambulatory Visit (HOSPITAL_COMMUNITY)
Admission: RE | Admit: 2016-07-08 | Discharge: 2016-07-08 | Disposition: A | Discharge status: Home / Self Care | Payer: 59 | Source: Other Acute Inpatient Hospital | Attending: Family Medicine | Admitting: Family Medicine

## 2016-07-08 DIAGNOSIS — Z0000 Encounter for general adult medical examination without abnormal findings: Secondary | ICD-10-CM | POA: Insufficient documentation

## 2016-07-08 DIAGNOSIS — E876 Hypokalemia: Secondary | ICD-10-CM | POA: Diagnosis present

## 2016-07-08 LAB — COMPREHENSIVE METABOLIC PANEL
ALT: 12 U/L — ABNORMAL LOW (ref 14–54)
AST: 23 U/L (ref 15–41)
Albumin: 4.8 g/dL (ref 3.5–5.0)
Alkaline Phosphatase: 67 U/L (ref 38–126)
Anion gap: 11 (ref 5–15)
BUN: 12 mg/dL (ref 6–20)
CO2: 23 mmol/L (ref 22–32)
Calcium: 9.2 mg/dL (ref 8.9–10.3)
Chloride: 105 mmol/L (ref 101–111)
Creatinine, Ser: 0.79 mg/dL (ref 0.44–1.00)
GFR calc Af Amer: >60 mL/min (ref >60)
GFR calc non Af Amer: >60 mL/min (ref >60)
Glucose, Bld: 116 mg/dL — ABNORMAL HIGH (ref 65–99)
Potassium: 3.2 mmol/L — ABNORMAL LOW (ref 3.5–5.1)
Sodium: 139 mmol/L (ref 135–145)
Total Bilirubin: 0.9 mg/dL (ref 0.3–1.2)
Total Protein: 7.8 g/dL (ref 6.5–8.1)

## 2016-07-08 LAB — CBC WITH DIFFERENTIAL/PLATELET
Basophils Absolute: 0 10*3/uL (ref 0.0–0.1)
Basophils Relative: 0 %
Eosinophils Absolute: 0 10*3/uL (ref 0.0–0.7)
Eosinophils Relative: 0 %
HCT: 41.1 % (ref 36.0–46.0)
Hemoglobin: 14.1 g/dL (ref 12.0–15.0)
Lymphocytes Relative: 32 %
Lymphs Abs: 1.6 10*3/uL (ref 0.7–4.0)
MCH: 31.1 pg (ref 26.0–34.0)
MCHC: 34.3 g/dL (ref 30.0–36.0)
MCV: 90.7 fL (ref 78.0–100.0)
Monocytes Absolute: 0.3 10*3/uL (ref 0.1–1.0)
Monocytes Relative: 6 %
Neutro Abs: 3.1 10*3/uL (ref 1.7–7.7)
Neutrophils Relative %: 62 %
Platelets: 272 10*3/uL (ref 150–400)
RBC: 4.53 MIL/uL (ref 3.87–5.11)
RDW: 12.5 % (ref 11.5–15.5)
WBC: 5.1 10*3/uL (ref 4.0–10.5)

## 2016-07-08 LAB — LIPID PANEL
Cholesterol: 172 mg/dL (ref 0–200)
HDL: 57 mg/dL (ref >40)
LDL Cholesterol: 103 mg/dL — ABNORMAL HIGH (ref 0–99)
Total CHOL/HDL Ratio: 3 RATIO
Triglycerides: 59 mg/dL (ref <150)
VLDL: 12 mg/dL (ref 0–40)

## 2016-07-08 LAB — TSH: TSH: 1.711 u[IU]/mL (ref 0.350–4.500)

## 2018-02-27 ENCOUNTER — Telehealth: Payer: Self-pay

## 2018-02-27 NOTE — Telephone Encounter (Signed)
Sent referral to scheduling and filed notes 

## 2018-03-03 ENCOUNTER — Other Ambulatory Visit: Payer: Self-pay | Admitting: Family Medicine

## 2018-03-03 DIAGNOSIS — Z1231 Encounter for screening mammogram for malignant neoplasm of breast: Secondary | ICD-10-CM

## 2018-03-05 ENCOUNTER — Encounter: Payer: Self-pay | Admitting: Cardiology

## 2018-03-05 ENCOUNTER — Ambulatory Visit: Payer: 59 | Admitting: Cardiology

## 2018-03-05 VITALS — BP 122/80 | HR 75 | Ht 66.0 in | Wt 139.2 lb

## 2018-03-05 DIAGNOSIS — E7800 Pure hypercholesterolemia, unspecified: Secondary | ICD-10-CM | POA: Diagnosis not present

## 2018-03-05 DIAGNOSIS — I300 Acute nonspecific idiopathic pericarditis: Secondary | ICD-10-CM

## 2018-03-05 NOTE — Patient Instructions (Signed)
Medication Instructions:  NO CHANGE If you need a refill on your cardiac medications before your next appointment, please call your pharmacy.   Lab work: Your physician recommends that you HAVE LAB WORK TODAY If you have labs (blood work) drawn today and your tests are completely normal, you will receive your results only by: Marland Kitchen MyChart Message (if you have MyChart) OR . A paper copy in the mail If you have any lab test that is abnormal or we need to change your treatment, we will call you to review the results.  Testing/Procedures: Your physician has requested that you have an echocardiogram. Echocardiography is a painless test that uses sound waves to create images of your heart. It provides your doctor with information about the size and shape of your heart and how well your heart's chambers and valves are working. This procedure takes approximately one hour. There are no restrictions for this procedure.  [place]  Follow-Up: At [facility], you and your health needs are our priority.  As part of our continuing mission to provide you with exceptional heart care, we have created designated Provider Care Teams.  These Care Teams include your primary Cardiologist (physician) and Advanced Practice Providers (APPs -  Physician Assistants and Nurse Practitioners) who all work together to provide you with the care you need, when you need it. You will need a follow up appointment in 6 months.  Please call our office 2 months in advance to schedule this appointment.  You may see Kirk Ruths MD or one of the following Advanced Practice Providers on your designated Care Team:   Kerin Ransom, PA-C Roby Lofts, [place] . Sande Rives, PA-C CALL IN June TO SCHEDULE APPOINTMENT IN [place]
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
## 2018-03-06 LAB — LIPID PANEL
Chol/HDL Ratio: 3.5 ratio (ref 0.0–4.4)
Cholesterol, Total: 201 mg/dL — ABNORMAL HIGH (ref 100–199)
HDL: 57 mg/dL (ref >39)
LDL Calculated: 132 mg/dL — ABNORMAL HIGH (ref 0–99)
Triglycerides: 59 mg/dL (ref 0–149)
VLDL Cholesterol Cal: 12 mg/dL (ref 5–40)

## 2018-03-06 LAB — SEDIMENTATION RATE: Sed Rate: 2 mm/hr (ref 0–32)

## 2018-03-07 ENCOUNTER — Telehealth: Payer: Self-pay | Admitting: *Deleted

## 2018-03-07 DIAGNOSIS — E7800 Pure hypercholesterolemia, unspecified: Secondary | ICD-10-CM

## 2018-03-07 MED ORDER — ROSUVASTATIN CALCIUM 20 MG PO TABS: 20.0000 mg | ORAL_TABLET | Freq: Every day | ORAL | 3 refills | Status: DC

## 2018-03-07 NOTE — Telephone Encounter (Signed)
Results released to my chart  °New script sent to the pharmacy  °Lab orders mailed to the pt  °

## 2018-03-07 NOTE — Telephone Encounter (Signed)
-----   Message from Lelon Perla, MD sent at 03/06/2018  6:49 AM EST ----- Given family history, would treat with crestor 20 mg daily; lipids and liver 8 weeks Kirk Ruths

## 2018-03-13 ENCOUNTER — Ambulatory Visit (HOSPITAL_COMMUNITY): Payer: 59 | Attending: Cardiology

## 2018-03-13 DIAGNOSIS — I300 Acute nonspecific idiopathic pericarditis: Secondary | ICD-10-CM | POA: Insufficient documentation

## 2018-04-03 ENCOUNTER — Ambulatory Visit
Admission: RE | Admit: 2018-04-03 | Discharge: 2018-04-03 | Disposition: A | Discharge status: Home / Self Care | Payer: 59 | Source: Ambulatory Visit | Attending: Family Medicine | Admitting: Family Medicine

## 2018-04-03 DIAGNOSIS — Z1231 Encounter for screening mammogram for malignant neoplasm of breast: Secondary | ICD-10-CM

## 2018-06-17 ENCOUNTER — Telehealth: Payer: 59 | Admitting: Physician Assistant

## 2018-06-17 DIAGNOSIS — R300 Dysuria: Secondary | ICD-10-CM

## 2018-06-17 MED ORDER — NITROFURANTOIN MONOHYD MACRO 100 MG PO CAPS: 100.0000 mg | ORAL_CAPSULE | Freq: Two times a day (BID) | ORAL | 0 refills | Status: AC

## 2018-06-17 NOTE — Progress Notes (Signed)
We are sorry that you are not feeling well.  Here is how we plan to help!  Based on what you shared with me it looks like you most likely have a simple urinary tract infection.  A UTI (Urinary Tract Infection) is a bacterial infection of the bladder.  Most cases of urinary tract infections are simple to treat but a key part of your care is to encourage you to drink plenty of fluids and watch your symptoms carefully.  I have prescribed MacroBid 100 mg twice a day for 5 days.  Your symptoms should gradually improve. Call us if the burning in your urine worsens, you develop worsening fever, back pain or pelvic pain or if your symptoms do not resolve after completing the antibiotic.  Urinary tract infections can be prevented by drinking plenty of water to keep your body hydrated.  Also be sure when you wipe, wipe from front to back and don't hold it in!  If possible, empty your bladder every 4 hours.  Your e-visit answers were reviewed by a board certified advanced clinical practitioner to complete your personal care plan.  Depending on the condition, your plan could have included both over the counter or prescription medications.  If there is a problem please reply once you have received a response from your provider.  Your safety is important to [place].  If you have drug allergies check your prescription carefully.    You can use MyChart to ask questions about today's visit, request a non-urgent call back, or ask for a work or school excuse for 24 hours related to this e-Visit. If it has been greater than 24 hours you will need to follow up with your provider, or enter a new e-Visit to address those concerns.   You will get an e-mail in the next two days asking about your experience.  I hope that your e-visit has been valuable and will speed your recovery. Thank you for using e-visits.    ===View-only below this line===   ----- Message -----    From: [name]    Sent: 06/17/2018  8:11 AM  EDT      To: E-Visit Mailing List Subject: E-Visit Submission: Urinary Problems  E-Visit Submission: Urinary Problems --------------------------------  Question: Which of the following are you experiencing? Answer:   Pain while passing urine  Question: When you have pain when passing urine, which of these apply? Answer:   The pain feels like it is on the inside  Question: Are you able to pass urine? Answer:   Yes, I can pass urine.  Question: How long have you had pain or difficulty passing urine? Answer:   More  than two days but less than one week  Question: Do you have a fever? Answer:   No, I do not have a fever  Question: Do you have any of the following? Answer:   I have belly pain with this illness  Question: Do you have an exaggerated sensation of the need to pass urine? Answer:   Yes, the sensation is exaggerated  Question: Do you have the urge to urinate more of less frequently than normal? Answer:   More frequently  Question: What does your urine look like? Answer:   It is clear  Question: Do you have any of the following? Answer:   None of the above  Question: Do you have any of the following? Answer:   No discharge  Question: Do you have any sores on your genitals?  Answer:   No  Question: Do you have any history of kidney dysfunction or kidney problems? Answer:   No  Question: Within the past 3 months, have you had any surgery on your kidneys or bladder, or have you had a tube inserted to collect your urine? Answer:   No, I have never had either  Question: Have you had similar symptoms in the past? Answer:   Yes, I have had similar symptoms more than once before  Question: If you had similar symptoms in the past, did any of the following work? Answer:   Pills for urine infection  Question: Please list any additional comments  Answer:   Dysuria, increased frequency, feeling of incomplete emptying, suprapubic pain, bladder spasms, and fatigue began  over the weekend. Water intake increased and took OTC Phenazopyridine HCl x 2 days with some relief while taking, but return of sx with completion of course.  Question: Please list your medication allergies that you may have ? (If 'none' , please list as 'none') Answer:   Vicodin  Question: Are you pregnant? Answer:   I am confident that I am not pregnant  Question: Are you breastfeeding? Answer:   No  A total of 5-10 minutes was spent evaluating this patients questionnaire and formulating a plan of care.

## 2018-08-06 ENCOUNTER — Telehealth: Payer: Self-pay | Admitting: *Deleted

## 2018-08-06 NOTE — Telephone Encounter (Signed)
A message was left, re: follow up visit. 

## 2018-12-15 ENCOUNTER — Encounter: Payer: Self-pay | Admitting: *Deleted
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
## 2019-01-02 ENCOUNTER — Telehealth (INDEPENDENT_AMBULATORY_CARE_PROVIDER_SITE_OTHER): Payer: 59 | Admitting: Cardiology

## 2019-01-02 ENCOUNTER — Encounter: Payer: Self-pay | Admitting: Cardiology

## 2019-01-02 VITALS — BP 102/60 | HR 70 | Ht 67.0 in | Wt 143.0 lb

## 2019-01-02 DIAGNOSIS — I300 Acute nonspecific idiopathic pericarditis: Secondary | ICD-10-CM | POA: Diagnosis not present

## 2019-01-02 DIAGNOSIS — Z136 Encounter for screening for cardiovascular disorders: Secondary | ICD-10-CM | POA: Diagnosis not present

## 2019-01-02 DIAGNOSIS — E7800 Pure hypercholesterolemia, unspecified: Secondary | ICD-10-CM

## 2019-01-02 NOTE — Patient Instructions (Signed)
Medication Instructions:  RESTART ROSUVASTATIN 20 MG ONCE DAILY  *If you need a refill on your cardiac medications before your next appointment, please call your pharmacy*  Lab Work: Your physician recommends that you return for lab work in: [place]  If you have labs (blood work) drawn today and your tests are completely normal, you will receive your results only by: Marland Kitchen MyChart Message (if you have MyChart) OR . A paper copy in the mail If you have any lab test that is abnormal or we need to change your treatment, we will call you to review the results.  Testing/Procedures: CORONARY CALCIUM SCORE AT [place]  Follow-Up: At [facility], you and your health needs are our priority.  As part of our continuing mission to provide you with exceptional heart care, we have created designated Provider Care Teams.  These Care Teams include your primary Cardiologist (physician) and Advanced Practice Providers (APPs -  Physician Assistants and Nurse Practitioners) who all work together to provide you with the care you need, when you need it.  Your next appointment:   12 month(s)  The format for your next appointment:   Either In Person or Virtual  Provider:   You may see Kirk Ruths MD or one of the following Advanced Practice Providers on your designated Care Team:    Kerin Ransom, PA-C  [place], [place]  Coletta Memos, [place]

## 2019-01-21 ENCOUNTER — Other Ambulatory Visit: Payer: Self-pay

## 2019-01-21 ENCOUNTER — Ambulatory Visit (INDEPENDENT_AMBULATORY_CARE_PROVIDER_SITE_OTHER)
Admission: RE | Admit: 2019-01-21 | Discharge: 2019-01-21 | Disposition: A | Discharge status: Home / Self Care | Payer: Self-pay | Source: Ambulatory Visit | Attending: Cardiology | Admitting: Cardiology

## 2019-01-21 DIAGNOSIS — Z136 Encounter for screening for cardiovascular disorders: Secondary | ICD-10-CM

## 2019-01-21 DIAGNOSIS — E7800 Pure hypercholesterolemia, unspecified: Secondary | ICD-10-CM

## 2019-01-30 DIAGNOSIS — I319 Disease of pericardium, unspecified: Secondary | ICD-10-CM

## 2019-01-30 HISTORY — DX: Disease of pericardium, unspecified: I31.9

## 2019-03-25 ENCOUNTER — Other Ambulatory Visit: Payer: Self-pay

## 2019-03-25 DIAGNOSIS — E7800 Pure hypercholesterolemia, unspecified: Secondary | ICD-10-CM

## 2019-03-25 MED ORDER — ROSUVASTATIN CALCIUM 20 MG PO TABS: 20.0000 mg | ORAL_TABLET | Freq: Every day | ORAL | 3 refills | Status: DC

## 2019-04-07 DIAGNOSIS — E7800 Pure hypercholesterolemia, unspecified: Secondary | ICD-10-CM

## 2019-04-08 LAB — HEPATIC FUNCTION PANEL
AG Ratio: 2.1 (calc) (ref 1.0–2.5)
ALT: 33 U/L — ABNORMAL HIGH (ref 6–29)
AST: 36 U/L — ABNORMAL HIGH (ref 10–30)
Albumin: 4.4 g/dL (ref 3.6–5.1)
Alkaline phosphatase (APISO): 75 U/L (ref 31–125)
Bilirubin, Direct: 0.2 mg/dL (ref 0.0–0.2)
Globulin: 2.1 g/dL (calc) (ref 1.9–3.7)
Indirect Bilirubin: 0.5 mg/dL (calc) (ref 0.2–1.2)
Total Bilirubin: 0.7 mg/dL (ref 0.2–1.2)
Total Protein: 6.5 g/dL (ref 6.1–8.1)

## 2019-04-08 LAB — LIPID PANEL
Cholesterol: 139 mg/dL (ref <200)
HDL: 60 mg/dL (ref >=50)
LDL Cholesterol (Calc): 67 mg/dL (calc)
Non-HDL Cholesterol (Calc): 79 mg/dL (calc) (ref <130)
Total CHOL/HDL Ratio: 2.3 (calc) (ref <5.0)
Triglycerides: 42 mg/dL (ref <150)

## 2019-04-09 ENCOUNTER — Telehealth: Payer: Self-pay | Admitting: *Deleted

## 2019-04-09 DIAGNOSIS — E7800 Pure hypercholesterolemia, unspecified: Secondary | ICD-10-CM

## 2019-04-09 NOTE — Telephone Encounter (Addendum)
Left message for pt to call    ----- Message from Lelon Perla, MD sent at 04/09/2019  7:19 AM EST ----- Change crestor to 10 mg daily; lipids and liver 12 weeks Kirk Ruths

## 2019-04-15 MED ORDER — ROSUVASTATIN CALCIUM 20 MG PO TABS: 10.0000 mg | ORAL_TABLET | Freq: Every day | ORAL | 3 refills | Status: DC

## 2019-04-15 NOTE — Telephone Encounter (Signed)
Follow up  Pt returning call   Please call 

## 2019-04-15 NOTE — Telephone Encounter (Signed)
Spoke with pt, Aware of dr Jacalyn Lefevre recommendations. She will cut the 20 mg tablets in 1/2. Lab orders mailed to the pt

## 2019-07-03 ENCOUNTER — Ambulatory Visit: Payer: 59 | Admitting: Osteopathic Medicine

## 2019-07-03 ENCOUNTER — Encounter: Payer: Self-pay | Admitting: Osteopathic Medicine

## 2019-07-03 DIAGNOSIS — M9909 Segmental and somatic dysfunction of abdomen and other regions: Secondary | ICD-10-CM

## 2019-07-03 DIAGNOSIS — M9901 Segmental and somatic dysfunction of cervical region: Secondary | ICD-10-CM | POA: Diagnosis not present

## 2019-07-03 NOTE — Progress Notes (Signed)
[name] is a 44 y.o. female who presents to  [place] at [facility]  today, 07/03/19, seeking care for the following:  Neck pain and headache - ongoing 6+ weeks, worse at L occiput and radiates into L side of head, no migraine hx, no concerning neuro deficits.      ASSESSMENT & PLAN with other pertinent history/findings:  The primary encounter diagnosis was Somatic dysfunction of spine, cervical. A diagnosis of Somatic dysfuncton of other region was also pertinent to this visit.  PE: tender point L OA OMT applied to C-spine and occiput FPR and MFR to (+)relief      Follow-up instructions: Return if symptoms worsen or fail to improve, repeat OMT prn.                                         LMP 12/21/2010   Current Meds  Medication Sig   amphetamine-dextroamphetamine (ADDERALL XR) 10 MG 24 hr capsule Take 10 mg by mouth daily.   hydrOXYzine (ATARAX/VISTARIL) 25 MG tablet Take 25 mg by mouth 3 (three) times daily as needed.   rosuvastatin (CRESTOR) 20 MG tablet Take 0.5 tablets (10 mg total) by mouth daily.   sertraline (ZOLOFT) 50 MG tablet Take 50 mg by mouth daily.    No results found for this or any previous visit (from the past 72 hour(s)).  No results found.  No flowsheet data found.  No flowsheet data found.    All questions at time of visit were answered - patient instructed to contact office with any additional concerns or updates.  ER/RTC precautions were reviewed with the patient.  Please note: voice recognition software was used to produce this document, and typos may escape review. Please contact Dr. Sheppard Coil for any needed clarifications.

## 2019-07-07 DIAGNOSIS — E7800 Pure hypercholesterolemia, unspecified: Secondary | ICD-10-CM

## 2019-07-22 LAB — HEPATIC FUNCTION PANEL
AG Ratio: 2 (calc) (ref 1.0–2.5)
ALT: 17 U/L (ref 6–29)
AST: 16 U/L (ref 10–30)
Albumin: 4.7 g/dL (ref 3.6–5.1)
Alkaline phosphatase (APISO): 66 U/L (ref 31–125)
Bilirubin, Direct: 0.2 mg/dL (ref 0.0–0.2)
Globulin: 2.3 g/dL (calc) (ref 1.9–3.7)
Indirect Bilirubin: 0.5 mg/dL (calc) (ref 0.2–1.2)
Total Bilirubin: 0.7 mg/dL (ref 0.2–1.2)
Total Protein: 7 g/dL (ref 6.1–8.1)

## 2019-07-22 LAB — LIPID PANEL
Cholesterol: 132 mg/dL (ref <200)
HDL: 58 mg/dL (ref >=50)
LDL Cholesterol (Calc): 58 mg/dL (calc)
Non-HDL Cholesterol (Calc): 74 mg/dL (calc) (ref <130)
Total CHOL/HDL Ratio: 2.3 (calc) (ref <5.0)
Triglycerides: 79 mg/dL (ref <150)

## 2019-07-27 ENCOUNTER — Telehealth: Payer: Self-pay | Admitting: Physician Assistant

## 2019-07-27 MED ORDER — AMBULATORY NON FORMULARY MEDICATION: 0 refills | Status: DC

## 2019-07-27 NOTE — Telephone Encounter (Signed)
Weight 143.  Working on weight loss for months.  Exercising regularly Would like to try wegovy.

## 2019-08-31 ENCOUNTER — Other Ambulatory Visit: Payer: Self-pay | Admitting: Physician Assistant

## 2019-08-31 MED ORDER — WEGOVY 0.5 MG/0.5ML ~~LOC~~ SOAJ: 0.5000 mg | SUBCUTANEOUS | 0 refills | Status: DC

## 2019-08-31 NOTE — Progress Notes (Signed)
Lost 4lbs. Tolerated well. Minimal constipation.

## 2019-09-25 ENCOUNTER — Other Ambulatory Visit: Payer: Self-pay | Admitting: Physician Assistant

## 2019-09-25 MED ORDER — WEGOVY 1 MG/0.5ML ~~LOC~~ SOAJ: 1.0000 mg | SUBCUTANEOUS | 0 refills | Status: DC

## 2019-09-25 NOTE — Progress Notes (Signed)
Lost 8lbs and tolerating well. Sent wegovy 1mg  to pharmacy.

## 2019-10-09 ENCOUNTER — Telehealth: Payer: Self-pay | Admitting: Physician Assistant

## 2019-10-09 DIAGNOSIS — T148XXA Other injury of unspecified body region, initial encounter: Secondary | ICD-10-CM

## 2019-10-09 MED ORDER — CYCLOBENZAPRINE HCL 10 MG PO TABS: 10.0000 mg | ORAL_TABLET | Freq: Three times a day (TID) | ORAL | 0 refills | Status: DC | PRN

## 2019-10-09 NOTE — Telephone Encounter (Signed)
Neck and upper back strain from being on the computer all the time.

## 2019-10-15 ENCOUNTER — Other Ambulatory Visit: Payer: Self-pay | Admitting: Neurology

## 2019-10-15 MED ORDER — WEGOVY 0.5 MG/0.5ML ~~LOC~~ SOAJ: 0.5000 mg | SUBCUTANEOUS | 2 refills | Status: DC

## 2019-10-23 ENCOUNTER — Other Ambulatory Visit: Payer: Self-pay | Admitting: Neurology

## 2019-10-23 MED ORDER — WEGOVY 0.5 MG/0.5ML ~~LOC~~ SOAJ: 1.0000 mg | SUBCUTANEOUS | 2 refills | Status: DC

## 2019-10-29 ENCOUNTER — Other Ambulatory Visit: Payer: Self-pay | Admitting: *Deleted

## 2019-10-29 MED ORDER — WEGOVY 1 MG/0.5ML ~~LOC~~ SOAJ: 1.0000 mg | SUBCUTANEOUS | 0 refills | Status: DC

## 2019-11-10 ENCOUNTER — Other Ambulatory Visit: Payer: Self-pay | Admitting: Neurology

## 2019-11-10 MED ORDER — NITROFURANTOIN MONOHYD MACRO 100 MG PO CAPS: 100.0000 mg | ORAL_CAPSULE | Freq: Two times a day (BID) | ORAL | 0 refills | Status: DC

## 2019-11-10 MED ORDER — FLUCONAZOLE 150 MG PO TABS: 150.0000 mg | ORAL_TABLET | Freq: Once | ORAL | 0 refills | Status: DC

## 2019-11-10 MED FILL — FLUCONAZOLE 150 MG TABS: 150 | 3 days supply | Qty: 2 | Fill #0

## 2019-11-10 MED FILL — NITROFURANTOIN MONO-MCR 100: 100 | 7 days supply | Qty: 14 | Fill #0

## 2019-12-29 ENCOUNTER — Encounter: Payer: Self-pay | Admitting: *Deleted

## 2020-01-06 MED ORDER — WEGOVY 2.4 MG/0.75ML ~~LOC~~ SOAJ: 2.4000 mg | SUBCUTANEOUS | 0 refills | Status: DC

## 2020-01-13 ENCOUNTER — Telehealth: Payer: Self-pay | Admitting: *Deleted

## 2020-01-13 DIAGNOSIS — R918 Other nonspecific abnormal finding of lung field: Secondary | ICD-10-CM

## 2020-01-13 NOTE — Telephone Encounter (Signed)
Left message for pt, December is the month we need to repeat the CT scan to look at the nodule that they saw in your lung when they did the calcium scoring. This scan will be done at [facility] at [place]. I will put the order in so someone can contact you about getting scheduled.

## 2020-02-03 ENCOUNTER — Ambulatory Visit
Admission: RE | Admit: 2020-02-03 | Discharge: 2020-02-03 | Disposition: A | Discharge status: Home / Self Care | Payer: 59 | Source: Ambulatory Visit | Attending: Cardiology | Admitting: Cardiology

## 2020-02-03 DIAGNOSIS — R918 Other nonspecific abnormal finding of lung field: Secondary | ICD-10-CM

## 2020-02-09 ENCOUNTER — Encounter: Payer: Self-pay | Admitting: Cardiology

## 2020-02-09 DIAGNOSIS — M329 Systemic lupus erythematosus, unspecified: Secondary | ICD-10-CM | POA: Insufficient documentation

## 2020-02-09 DIAGNOSIS — Z8679 Personal history of other diseases of the circulatory system: Secondary | ICD-10-CM | POA: Insufficient documentation

## 2020-02-09 DIAGNOSIS — E785 Hyperlipidemia, unspecified: Secondary | ICD-10-CM | POA: Insufficient documentation

## 2020-02-10 ENCOUNTER — Telehealth (INDEPENDENT_AMBULATORY_CARE_PROVIDER_SITE_OTHER): Payer: 59 | Admitting: Cardiology

## 2020-02-10 ENCOUNTER — Encounter: Payer: Self-pay | Admitting: Cardiology

## 2020-02-10 VITALS — BP 104/61 | HR 72 | Ht 67.0 in | Wt 128.0 lb

## 2020-02-10 DIAGNOSIS — Z8679 Personal history of other diseases of the circulatory system: Secondary | ICD-10-CM

## 2020-02-10 DIAGNOSIS — M329 Systemic lupus erythematosus, unspecified: Secondary | ICD-10-CM | POA: Diagnosis not present

## 2020-02-10 DIAGNOSIS — E785 Hyperlipidemia, unspecified: Secondary | ICD-10-CM

## 2020-02-10 NOTE — Patient Instructions (Signed)
Medication Instructions:  Continue current medications  *If you need a refill on your cardiac medications before your next appointment, please call your pharmacy*   Lab Work: None Ordered   Testing/Procedures: None Ordered   Follow-Up: At [facility], you and your health needs are our priority.  As part of our continuing mission to provide you with exceptional heart care, we have created designated Provider Care Teams.  These Care Teams include your primary Cardiologist (physician) and Advanced Practice Providers (APPs -  Physician Assistants and Nurse Practitioners) who all work together to provide you with the care you need, when you need it.  We recommend signing up for the patient portal called "MyChart".  Sign up information is provided on this After Visit Summary.  MyChart is used to connect with patients for Virtual Visits (Telemedicine).  Patients are able to view lab/test results, encounter notes, upcoming appointments, etc.  Non-urgent messages can be sent to your provider as well.   To learn more about what you can do with MyChart, go to [email or web address]://www.mychart.com.    Your next appointment:   1 year(s)  The format for your next appointment:   In Person  Provider:   You may see Brian Crenshaw, MD or one of the following Advanced Practice Providers on your designated Care Team:    Luke Kilroy, PA-C  Callie Goodrich, PA-C  Jesse Cleaver, FNP      
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
## 2020-04-22 ENCOUNTER — Other Ambulatory Visit: Payer: Self-pay

## 2020-04-22 DIAGNOSIS — E7800 Pure hypercholesterolemia, unspecified: Secondary | ICD-10-CM

## 2020-04-22 MED ORDER — ROSUVASTATIN CALCIUM 20 MG PO TABS: 10.0000 mg | ORAL_TABLET | Freq: Every day | ORAL | 3 refills | Status: DC

## 2020-04-25 ENCOUNTER — Other Ambulatory Visit: Payer: Self-pay

## 2020-04-25 DIAGNOSIS — E7800 Pure hypercholesterolemia, unspecified: Secondary | ICD-10-CM

## 2020-04-25 MED ORDER — ROSUVASTATIN CALCIUM 10 MG PO TABS: 10.0000 mg | ORAL_TABLET | Freq: Every day | ORAL | 3 refills | Status: DC

## 2020-06-15 ENCOUNTER — Other Ambulatory Visit: Payer: Self-pay | Admitting: Physician Assistant

## 2020-06-15 MED ORDER — PROMETHAZINE HCL 25 MG PO TABS: 25.0000 mg | ORAL_TABLET | Freq: Four times a day (QID) | ORAL | 2 refills | Status: AC | PRN

## 2020-09-23 ENCOUNTER — Other Ambulatory Visit (INDEPENDENT_AMBULATORY_CARE_PROVIDER_SITE_OTHER): Payer: 59 | Admitting: Physician Assistant

## 2020-09-23 DIAGNOSIS — F419 Anxiety disorder, unspecified: Secondary | ICD-10-CM

## 2020-09-23 DIAGNOSIS — F40243 Fear of flying: Secondary | ICD-10-CM | POA: Insufficient documentation

## 2020-09-23 MED ORDER — ALPRAZOLAM 0.25 MG PO TABS: ORAL_TABLET | ORAL | 0 refills | Status: DC

## 2020-09-23 NOTE — Progress Notes (Signed)
Pt c/o increased anxiety around 15hr flight for upcoming vacation. She would like something to use to help her relax.

## 2020-11-28 ENCOUNTER — Other Ambulatory Visit (HOSPITAL_BASED_OUTPATIENT_CLINIC_OR_DEPARTMENT_OTHER): Payer: Self-pay

## 2020-11-28 MED ORDER — FLUARIX QUADRIVALENT 0.5 ML IM SUSY
PREFILLED_SYRINGE | INTRAMUSCULAR | 0 refills | Status: DC
  Filled 2020-11-28: qty 0.5, 1d supply, fill #0

## 2021-02-08 ENCOUNTER — Telehealth: Payer: Self-pay | Admitting: *Deleted

## 2021-02-08 DIAGNOSIS — R911 Solitary pulmonary nodule: Secondary | ICD-10-CM

## 2021-02-08 NOTE — Telephone Encounter (Signed)
Left message for patient, order has been placed for the CT wo contrast to follow up on her lung nodule. Someone from [facility] should call the patient to schedule.

## 2021-03-03 ENCOUNTER — Encounter: Payer: Self-pay | Admitting: *Deleted

## 2021-03-03 ENCOUNTER — Other Ambulatory Visit (HOSPITAL_BASED_OUTPATIENT_CLINIC_OR_DEPARTMENT_OTHER): Payer: Self-pay

## 2021-03-03 MED ORDER — SERTRALINE HCL 50 MG PO TABS
50.0000 mg | ORAL_TABLET | Freq: Every day | ORAL | 3 refills | Status: DC
  Filled 2021-04-14: qty 90, 90d supply, fill #0

## 2021-03-03 MED ORDER — RIZATRIPTAN BENZOATE 10 MG PO TABS
ORAL_TABLET | ORAL | 5 refills | Status: DC
  Filled 2021-07-06: qty 9, 15d supply, fill #0
  Filled 2021-07-20: qty 18, 30d supply, fill #1
  Filled 2021-08-15: qty 15, 30d supply, fill #2

## 2021-03-03 MED ORDER — HYDROXYZINE HCL 25 MG PO TABS
ORAL_TABLET | ORAL | 3 refills | Status: DC
  Filled 2021-05-12: qty 90, 90d supply, fill #0
  Filled 2021-08-15: qty 90, 90d supply, fill #1

## 2021-03-30 ENCOUNTER — Other Ambulatory Visit (HOSPITAL_COMMUNITY): Payer: Self-pay

## 2021-04-06 ENCOUNTER — Other Ambulatory Visit (HOSPITAL_BASED_OUTPATIENT_CLINIC_OR_DEPARTMENT_OTHER): Payer: Self-pay

## 2021-04-14 ENCOUNTER — Ambulatory Visit (INDEPENDENT_AMBULATORY_CARE_PROVIDER_SITE_OTHER): Payer: No Typology Code available for payment source | Admitting: Physician Assistant

## 2021-04-14 ENCOUNTER — Encounter: Payer: Self-pay | Admitting: Physician Assistant

## 2021-04-14 ENCOUNTER — Other Ambulatory Visit: Payer: Self-pay

## 2021-04-14 ENCOUNTER — Other Ambulatory Visit (HOSPITAL_BASED_OUTPATIENT_CLINIC_OR_DEPARTMENT_OTHER): Payer: Self-pay

## 2021-04-14 VITALS — BP 122/66 | HR 80 | Ht 67.0 in | Wt 143.0 lb

## 2021-04-14 DIAGNOSIS — E785 Hyperlipidemia, unspecified: Secondary | ICD-10-CM

## 2021-04-14 DIAGNOSIS — Z23 Encounter for immunization: Secondary | ICD-10-CM | POA: Diagnosis not present

## 2021-04-14 DIAGNOSIS — Z1329 Encounter for screening for other suspected endocrine disorder: Secondary | ICD-10-CM

## 2021-04-14 DIAGNOSIS — Z1159 Encounter for screening for other viral diseases: Secondary | ICD-10-CM

## 2021-04-14 DIAGNOSIS — Z79899 Other long term (current) drug therapy: Secondary | ICD-10-CM

## 2021-04-14 DIAGNOSIS — F3341 Major depressive disorder, recurrent, in partial remission: Secondary | ICD-10-CM

## 2021-04-14 DIAGNOSIS — F419 Anxiety disorder, unspecified: Secondary | ICD-10-CM | POA: Diagnosis not present

## 2021-04-14 DIAGNOSIS — Z131 Encounter for screening for diabetes mellitus: Secondary | ICD-10-CM

## 2021-04-14 DIAGNOSIS — Z114 Encounter for screening for human immunodeficiency virus [HIV]: Secondary | ICD-10-CM

## 2021-04-14 DIAGNOSIS — F988 Other specified behavioral and emotional disorders with onset usually occurring in childhood and adolescence: Secondary | ICD-10-CM

## 2021-04-14 MED ORDER — SERTRALINE HCL 50 MG PO TABS
ORAL_TABLET | ORAL | 3 refills | Status: DC
  Filled 2021-04-14: qty 135, fill #0
  Filled 2021-07-06: qty 135, 90d supply, fill #0
  Filled 2021-11-03: qty 135, 90d supply, fill #1
  Filled 2022-02-12: qty 135, 90d supply, fill #2

## 2021-04-17 ENCOUNTER — Encounter: Payer: Self-pay | Admitting: Physician Assistant

## 2021-04-17 DIAGNOSIS — F988 Other specified behavioral and emotional disorders with onset usually occurring in childhood and adolescence: Secondary | ICD-10-CM | POA: Insufficient documentation
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
## 2021-05-12 ENCOUNTER — Other Ambulatory Visit (HOSPITAL_BASED_OUTPATIENT_CLINIC_OR_DEPARTMENT_OTHER): Payer: Self-pay

## 2021-05-27 IMAGING — CT CT HEART SCORING
2 series · 14 of 20 positions shown, 16 images · non-contrast
Comparison: None.
COMPARISON: None.
COMPARISON: None.

Addendum:
EXAM:
OVER-READ INTERPRETATION  CT CHEST

The following report is an over-read performed by radiologist Dr.
Torcaru Somochkina [REDACTED] on 01/21/2019. This
over-read does not include interpretation of cardiac or coronary
anatomy or pathology. The coronary calcium score interpretation by
the cardiologist is attached.
CLINICAL DATA: Risk stratification 43 year old female
Coronary Calcium Score
TECHNIQUE: The patient was scanned on a Siemens Force scanner. Axial
non-contrast 3 mm slices were carried out through the heart. The
data set was analyzed on a dedicated work station and scored using
the Agatson method.
CLINICAL DATA: Risk stratification
TECHNIQUE: The patient was scanned on a Siemens Somatom 64 slice scanner. Axial

[Series 2: casc 3.0 i36f 2 bestdiast 68 % · axial · 0.27mm/px · z∈[-238,-132]mm · 8 of 47 slices shown, 10 images]
[im 6/47  vessel]
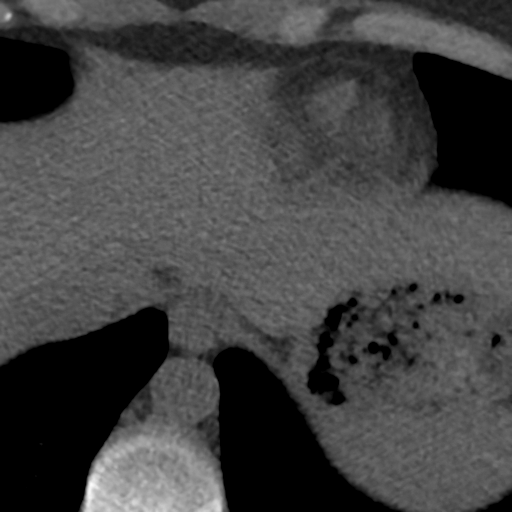
[im 6/47  lung]
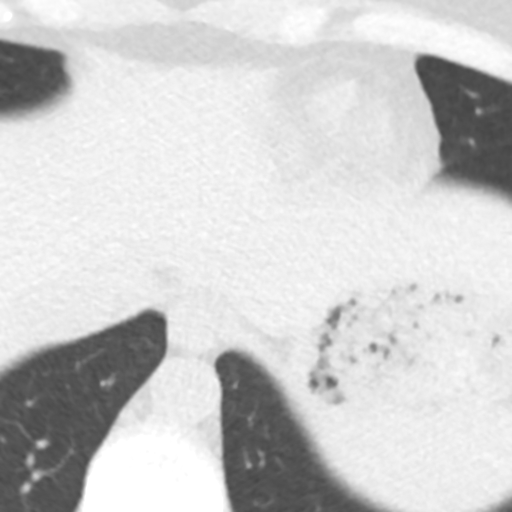
[im 11/47  vessel]
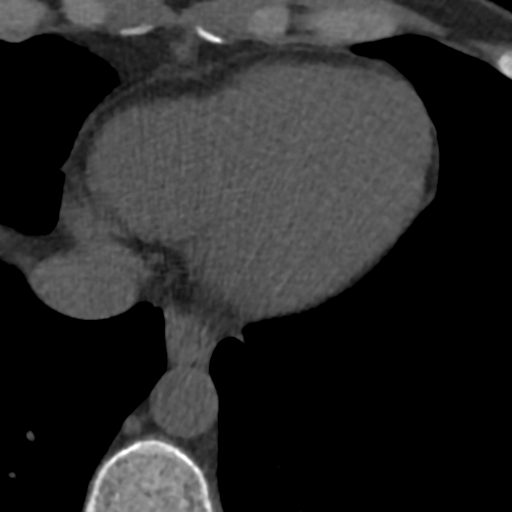
[im 16/47  vessel]
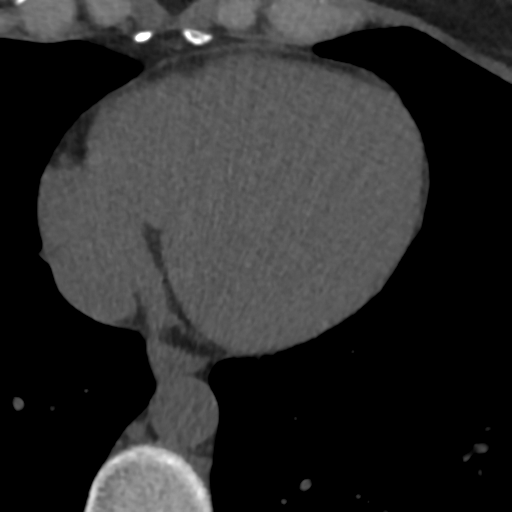
[im 21/47  vessel]
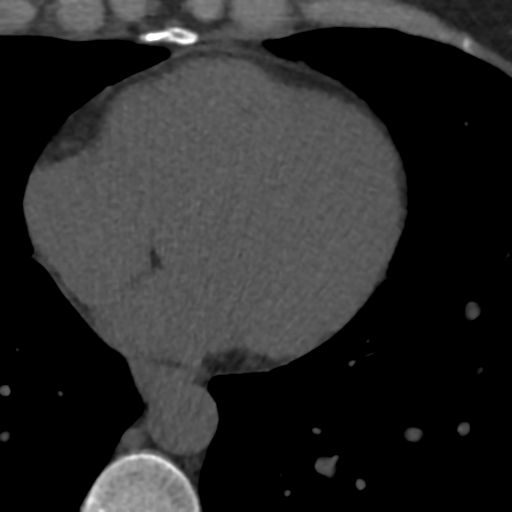
[im 26/47  vessel]
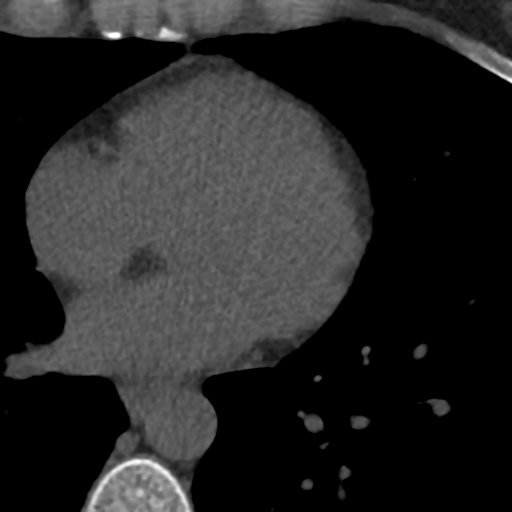
[im 26/47  lung]
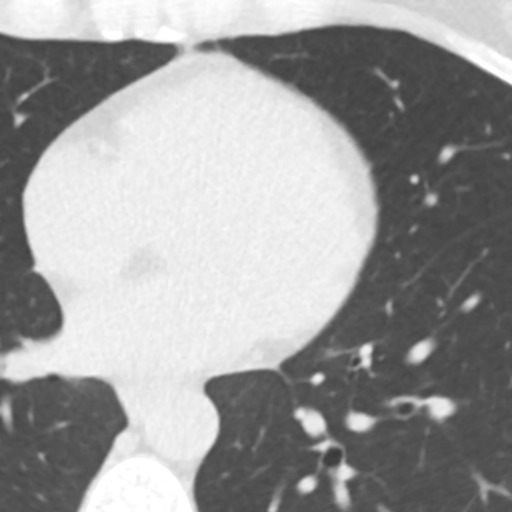
[im 31/47  vessel]
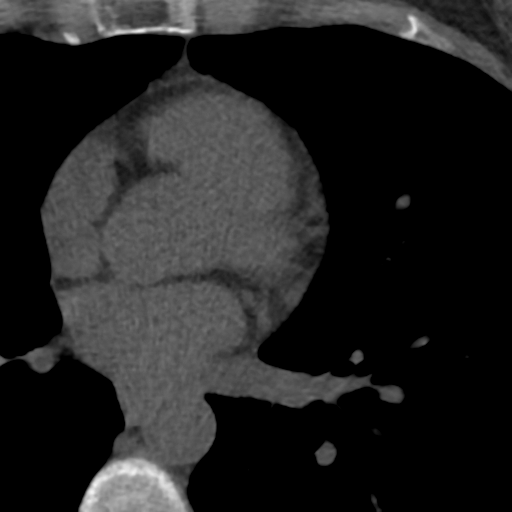
[im 36/47  vessel]
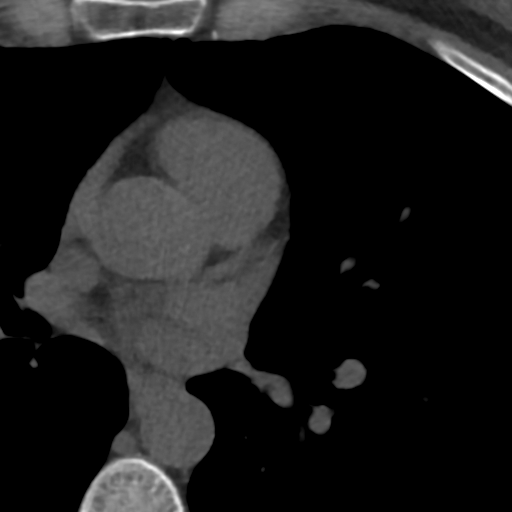
[im 41/47  vessel]
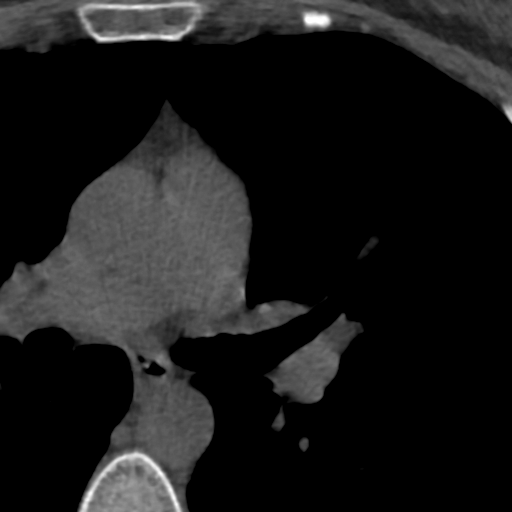

[Series 4: lung st 68 % · axial · 0.64mm/px · z∈[-238,-162]mm · 6 of 47 slices shown]
[im 6/47  lung]
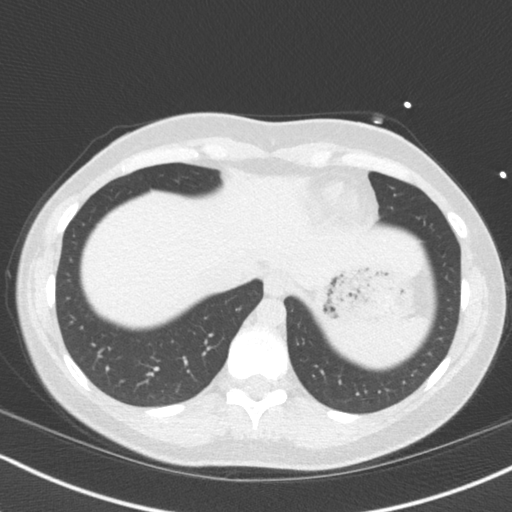
[im 11/47  lung]
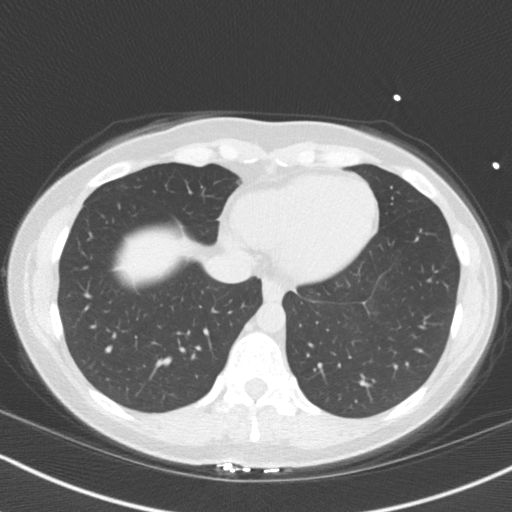
[im 16/47  lung]
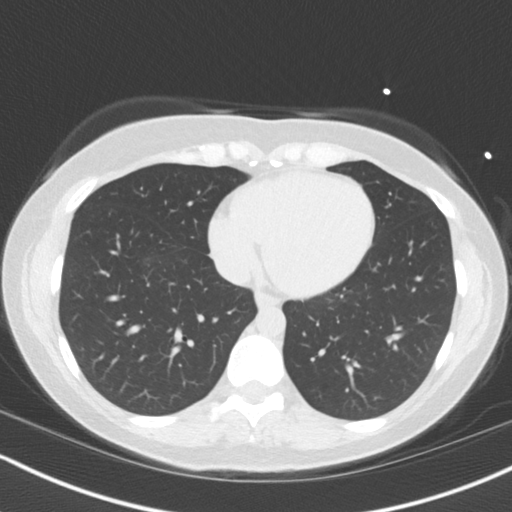
[im 21/47  lung]
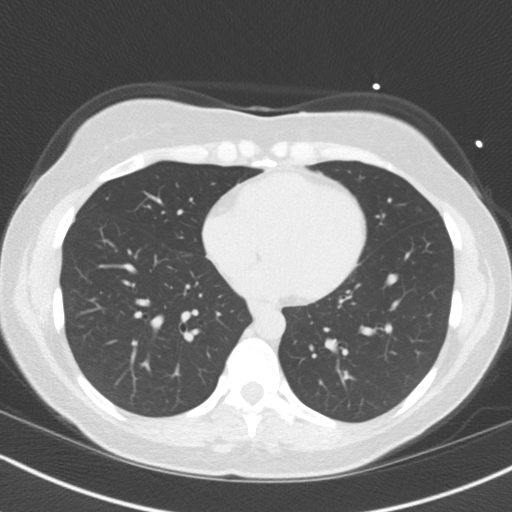
[im 26/47  lung]
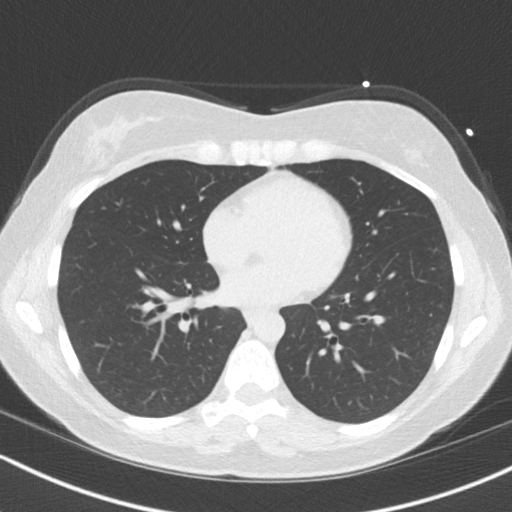
[im 31/47  lung]
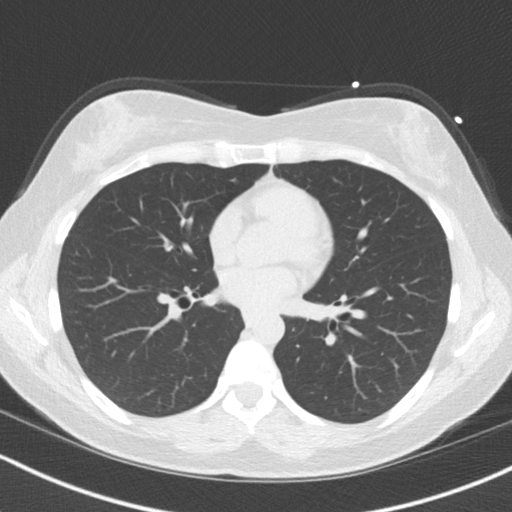

[14 of 20 positions shown; findings below may reference images not displayed]

FINDINGS: 4 mm right lower lobe nodule (axial image 45 of series 3). Within
the visualized portions of the thorax there are no other larger more
suspicious appearing pulmonary nodules or masses, there is no acute
consolidative airspace disease, no pleural effusions, no
pneumothorax and no lymphadenopathy. Visualized portions of the
upper abdomen are unremarkable. There are no aggressive appearing
lytic or blastic lesions noted in the visualized portions of the
skeleton.
IMPRESSION: 1. 4 mm right lower lobe nodule. This is nonspecific, but
statistically likely a benign nodule. No follow-up needed if patient
is low-risk. Non-contrast chest CT can be considered in 12 months if
patient is high-risk. This recommendation follows the consensus
statement: Guidelines for Management of Incidental Pulmonary Nodules
Detected on CT Images: From the [HOSPITAL] 4709; Radiology
FINDINGS: Non-cardiac: See separate report from [REDACTED].

Ascending Aorta: Normal

Pericardium: Normal

Coronary arteries: Normal origin.  No calcification
IMPRESSION: Coronary calcium score of 0. This was 0 percentile for age and sex
matched control.
FINDINGS: Non-cardiac: See separate report from [REDACTED].

Ascending aorta: Normal size

Pericardium: Normal

Coronary arteries: Normal origin
IMPRESSION: Coronary calcium score of 0. This was 0 percentile for age and sex
matched control.

Sosthenes Tee

*** End of Addendum ***
Addendum:
EXAM:
OVER-READ INTERPRETATION  CT CHEST

The following report is an over-read performed by radiologist Dr.
Torcaru Somochkina [REDACTED] on 01/21/2019. This
over-read does not include interpretation of cardiac or coronary
anatomy or pathology. The coronary calcium score interpretation by
the cardiologist is attached.
FINDINGS: 4 mm right lower lobe nodule (axial image 45 of series 3). Within
the visualized portions of the thorax there are no other larger more
suspicious appearing pulmonary nodules or masses, there is no acute
consolidative airspace disease, no pleural effusions, no
pneumothorax and no lymphadenopathy. Visualized portions of the
upper abdomen are unremarkable. There are no aggressive appearing
lytic or blastic lesions noted in the visualized portions of the
skeleton.
IMPRESSION: 1. 4 mm right lower lobe nodule. This is nonspecific, but
statistically likely a benign nodule. No follow-up needed if patient
is low-risk. Non-contrast chest CT can be considered in 12 months if
patient is high-risk. This recommendation follows the consensus
statement: Guidelines for Management of Incidental Pulmonary Nodules
Detected on CT Images: From the [HOSPITAL] 4709; Radiology
FINDINGS: Non-cardiac: See separate report from [REDACTED].

Ascending Aorta: Normal

Pericardium: Normal

Coronary arteries: Normal origin.  No calcification
IMPRESSION: Coronary calcium score of 0. This was 0 percentile for age and sex
matched control.

*** End of Addendum ***
EXAM:
OVER-READ INTERPRETATION  CT CHEST

The following report is an over-read performed by radiologist Dr.
Torcaru Somochkina [REDACTED] on 01/21/2019. This
over-read does not include interpretation of cardiac or coronary
anatomy or pathology. The coronary calcium score interpretation by
the cardiologist is attached.
FINDINGS: 4 mm right lower lobe nodule (axial image 45 of series 3). Within
the visualized portions of the thorax there are no other larger more
suspicious appearing pulmonary nodules or masses, there is no acute
consolidative airspace disease, no pleural effusions, no
pneumothorax and no lymphadenopathy. Visualized portions of the
upper abdomen are unremarkable. There are no aggressive appearing
lytic or blastic lesions noted in the visualized portions of the
skeleton.
IMPRESSION: 1. 4 mm right lower lobe nodule. This is nonspecific, but
statistically likely a benign nodule. No follow-up needed if patient
is low-risk. Non-contrast chest CT can be considered in 12 months if
patient is high-risk. This recommendation follows the consensus
statement: Guidelines for Management of Incidental Pulmonary Nodules
Detected on CT Images: From the [HOSPITAL] 4709; Radiology

## 2021-05-30 ENCOUNTER — Other Ambulatory Visit (HOSPITAL_BASED_OUTPATIENT_CLINIC_OR_DEPARTMENT_OTHER): Payer: Self-pay

## 2021-05-31 ENCOUNTER — Other Ambulatory Visit: Payer: Self-pay | Admitting: Physician Assistant

## 2021-05-31 ENCOUNTER — Other Ambulatory Visit (HOSPITAL_BASED_OUTPATIENT_CLINIC_OR_DEPARTMENT_OTHER): Payer: Self-pay

## 2021-05-31 DIAGNOSIS — E7800 Pure hypercholesterolemia, unspecified: Secondary | ICD-10-CM

## 2021-05-31 MED ORDER — ROSUVASTATIN CALCIUM 10 MG PO TABS
10.0000 mg | ORAL_TABLET | Freq: Every day | ORAL | 0 refills | Status: DC
  Filled 2021-05-31 – 2021-06-14 (×2): qty 90, 90d supply, fill #0

## 2021-06-13 ENCOUNTER — Other Ambulatory Visit (HOSPITAL_BASED_OUTPATIENT_CLINIC_OR_DEPARTMENT_OTHER): Payer: Self-pay

## 2021-06-14 ENCOUNTER — Telehealth (INDEPENDENT_AMBULATORY_CARE_PROVIDER_SITE_OTHER): Payer: No Typology Code available for payment source | Admitting: Physician Assistant

## 2021-06-14 ENCOUNTER — Encounter: Payer: Self-pay | Admitting: Physician Assistant

## 2021-06-14 ENCOUNTER — Other Ambulatory Visit (HOSPITAL_BASED_OUTPATIENT_CLINIC_OR_DEPARTMENT_OTHER): Payer: Self-pay

## 2021-06-14 DIAGNOSIS — F988 Other specified behavioral and emotional disorders with onset usually occurring in childhood and adolescence: Secondary | ICD-10-CM

## 2021-06-14 DIAGNOSIS — F419 Anxiety disorder, unspecified: Secondary | ICD-10-CM

## 2021-06-14 DIAGNOSIS — F529 Unspecified sexual dysfunction not due to a substance or known physiological condition: Secondary | ICD-10-CM | POA: Diagnosis not present

## 2021-06-14 DIAGNOSIS — F3341 Major depressive disorder, recurrent, in partial remission: Secondary | ICD-10-CM | POA: Diagnosis not present

## 2021-06-14 DIAGNOSIS — E7800 Pure hypercholesterolemia, unspecified: Secondary | ICD-10-CM

## 2021-06-14 MED ORDER — ROSUVASTATIN CALCIUM 10 MG PO TABS
10.0000 mg | ORAL_TABLET | Freq: Every day | ORAL | 3 refills | Status: DC
  Filled 2021-06-14 – 2021-11-03 (×2): qty 90, 90d supply, fill #0
  Filled 2022-02-12: qty 90, 90d supply, fill #1

## 2021-06-14 MED ORDER — AMPHETAMINE-DEXTROAMPHETAMINE 20 MG PO TABS
20.0000 mg | ORAL_TABLET | Freq: Two times a day (BID) | ORAL | 0 refills | Status: DC
  Filled 2021-08-15: qty 60, 30d supply, fill #0

## 2021-06-14 MED ORDER — AMPHETAMINE-DEXTROAMPHETAMINE 20 MG PO TABS
20.0000 mg | ORAL_TABLET | Freq: Two times a day (BID) | ORAL | 0 refills | Status: DC
  Filled 2021-06-14: qty 60, 30d supply, fill #0

## 2021-06-14 MED ORDER — AMPHETAMINE-DEXTROAMPHETAMINE 20 MG PO TABS
20.0000 mg | ORAL_TABLET | Freq: Two times a day (BID) | ORAL | 0 refills | Status: DC
  Filled 2021-07-20: qty 50, 25d supply, fill #0
  Filled 2021-07-21: qty 60, 30d supply, fill #0

## 2021-06-14 MED ORDER — BUPROPION HCL ER (SR) 100 MG PO TB12
100.0000 mg | ORAL_TABLET | Freq: Two times a day (BID) | ORAL | 2 refills | Status: DC
  Filled 2021-06-14: qty 60, 30d supply, fill #0
  Filled 2021-07-20: qty 60, 30d supply, fill #1
  Filled 2021-08-15: qty 60, 30d supply, fill #2

## 2021-06-14 NOTE — Progress Notes (Signed)
..Virtual Visit via Video Note ? ?I connected with [name] on 06/14/21 at  1:00 PM EDT by a video enabled telemedicine application and verified that I am speaking with the correct person using two identifiers. ? ?Location: ?Patient: work ?Provider: clinic ? ?Marland Kitchen.Participating in visit:  ?Patient: [name] ?Provider: Iran Planas PA-C ?  ?I discussed the limitations of evaluation and management by telemedicine and the availability of in person appointments. The patient expressed understanding and agreed to proceed. ? ?History of Present Illness: ?Pt is a 46 year old female with ADHD, MDD, anxiety who calls into the clinic for medication refills. ? ?She is out of her Adderall.  She has done well with Adderall.  She denies any concerns.  She sometimes does take half tablets. ? ?Her anxiety and depression have improved with increase of Zoloft.  She has however had some sexual dysfunction with this increase.  She wonders what she could do about this.  She does not want to change her Zoloft.  She denies any suicidal thoughts or homicidal idealizations. ? ? .. ?Active Ambulatory Problems  ?  Diagnosis Date Noted  ? Knee pain, left 04/20/2010  ? Patellofemoral pain syndrome 04/20/2010  ? Gait abnormality 04/20/2010  ? History of pericarditis 02/09/2020  ? Dyslipidemia 02/09/2020  ? Lupus ([place]) 02/09/2020  ? Fear of flying 09/23/2020  ? Depression 10/15/1994  ? Anxiety 10/15/1994  ? Attention deficit disorder (ADD) without hyperactivity 04/17/2021  ? Recurrent major depressive disorder, in partial remission ([place]) 06/14/2021  ? Sexual dysfunction in female 06/14/2021  ? ?Resolved Ambulatory Problems  ?  Diagnosis Date Noted  ? No Resolved Ambulatory Problems  ? ?Past Medical History:  ?Diagnosis Date  ? ADHD 1999  ? Colitis 2003  ? Dysautonomia ([place]) 1995  ? Exercise-induced asthma   ? Hyperlipidemia   ? IBS (irritable bowel syndrome) 2004  ? migraines 2000  ? Patellar dislocation 1997  ? Pericarditis 2021  ? PONV  (postoperative nausea and vomiting)   ? Tumor 1997  ? Vaso-vagal reaction   ? ? ?Observations/Objective: ?No acute distress ?Normal mood and appearance ?Normal breathing ? ? ?Assessment and Plan: ?..Tawanna was seen today for adhd. ? ?Diagnoses and all orders for this visit: ? ?Attention deficit disorder (ADD) without hyperactivity ?-     amphetamine-dextroamphetamine (ADDERALL) 20 MG tablet; Take 1 tablet (20 mg total) by mouth 2 (two) times daily. ?-     amphetamine-dextroamphetamine (ADDERALL) 20 MG tablet; Take 1 tablet (20 mg total) by mouth 2 (two) times daily. ?-     amphetamine-dextroamphetamine (ADDERALL) 20 MG tablet; Take 1 tablet (20 mg total) by mouth 2 (two) times daily. ? ?Pure hypercholesterolemia ?-     rosuvastatin (CRESTOR) 10 MG tablet; Take 1 tablet (10 mg total) by mouth daily. ? ?Sexual dysfunction in female ?-     buPROPion ER (WELLBUTRIN SR) 100 MG 12 hr tablet; Take 1 tablet (100 mg total) by mouth 2 (two) times daily. ? ?Recurrent major depressive disorder, in partial remission ([place]) ? ?Anxiety ? ? ?Adderall refilled for 3 months ?Follow up in 6 months ? ?Continue on zoloft for now added wellbutrin to see if would help with sexual dysfunction ?Discussed adding a vibrator as well to help with orgasm ? ?Reminder to get fasting labs.  ? ?Follow up in 6 months.  ? ? ? ?Follow Up Instructions: ? ?  ?I discussed the assessment and treatment plan with the patient. The patient was provided an opportunity to ask questions and  all were answered. The patient agreed with the plan and demonstrated an understanding of the instructions. ?  ?The patient was advised to call back or seek an in-person evaluation if the symptoms worsen or if the condition fails to improve as anticipated. ? ? ? ?Iran Planas, PA-C ? ?

## 2021-07-06 ENCOUNTER — Other Ambulatory Visit (HOSPITAL_BASED_OUTPATIENT_CLINIC_OR_DEPARTMENT_OTHER): Payer: Self-pay

## 2021-07-12 ENCOUNTER — Other Ambulatory Visit (HOSPITAL_BASED_OUTPATIENT_CLINIC_OR_DEPARTMENT_OTHER): Payer: Self-pay

## 2021-07-21 ENCOUNTER — Other Ambulatory Visit (HOSPITAL_BASED_OUTPATIENT_CLINIC_OR_DEPARTMENT_OTHER): Payer: Self-pay

## 2021-07-21 ENCOUNTER — Encounter: Payer: Self-pay | Admitting: Physician Assistant

## 2021-07-21 MED ORDER — CYCLOBENZAPRINE HCL 10 MG PO TABS: 10.0000 mg | ORAL_TABLET | Freq: Three times a day (TID) | ORAL | 1 refills | Status: DC | PRN

## 2021-07-21 MED ORDER — CYCLOBENZAPRINE HCL 10 MG PO TABS
10.0000 mg | ORAL_TABLET | Freq: Three times a day (TID) | ORAL | 1 refills | Status: DC | PRN
  Filled 2021-07-21: qty 30, 10d supply, fill #0

## 2021-07-22 ENCOUNTER — Other Ambulatory Visit (HOSPITAL_BASED_OUTPATIENT_CLINIC_OR_DEPARTMENT_OTHER): Payer: Self-pay

## 2021-07-24 ENCOUNTER — Other Ambulatory Visit (HOSPITAL_BASED_OUTPATIENT_CLINIC_OR_DEPARTMENT_OTHER): Payer: Self-pay

## 2021-08-15 ENCOUNTER — Telehealth: Payer: Self-pay | Admitting: Physician Assistant

## 2021-08-15 ENCOUNTER — Other Ambulatory Visit (HOSPITAL_BASED_OUTPATIENT_CLINIC_OR_DEPARTMENT_OTHER): Payer: Self-pay

## 2021-08-15 ENCOUNTER — Other Ambulatory Visit: Payer: Self-pay | Admitting: Physician Assistant

## 2021-08-15 MED ORDER — SCOPOLAMINE 1 MG/3DAYS TD PT72SCOPOLAMINE 1 MG/3DAYS
1.0000 | MEDICATED_PATCH | TRANSDERMAL | 0 refills | Status: DC
Start: 2021-08-15 — End: 2022-06-16
  Filled 2021-08-15: qty 10, 30d supply, fill #0

## 2021-08-15 MED ORDER — BUPROPION HCL ER (XL) 150 MG PO TB24
150.0000 mg | ORAL_TABLET | ORAL | 1 refills | Status: DC
  Filled 2021-08-15: qty 90, 90d supply, fill #0
  Filled 2021-11-03: qty 36, 36d supply, fill #1
  Filled 2021-11-03: qty 54, 54d supply, fill #1

## 2021-08-15 MED ORDER — ALPRAZOLAM 0.25 MG PO TABS
ORAL_TABLET | ORAL | 0 refills | Status: DC
  Filled 2021-08-15: qty 30, 30d supply, fill #0

## 2021-08-15 NOTE — Telephone Encounter (Signed)
Pt is leaving for [place] and needs xanax for flights and motion sickness patches for roller coasters. Sent to pharmacy.

## 2021-08-15 NOTE — Progress Notes (Signed)
Switch wellbutrin from SR to XL at patient's request.

## 2021-09-11 ENCOUNTER — Other Ambulatory Visit (HOSPITAL_BASED_OUTPATIENT_CLINIC_OR_DEPARTMENT_OTHER): Payer: Self-pay

## 2021-10-27 ENCOUNTER — Other Ambulatory Visit (HOSPITAL_BASED_OUTPATIENT_CLINIC_OR_DEPARTMENT_OTHER): Payer: Self-pay

## 2021-10-27 ENCOUNTER — Other Ambulatory Visit: Payer: Self-pay | Admitting: Physician Assistant

## 2021-10-27 DIAGNOSIS — F988 Other specified behavioral and emotional disorders with onset usually occurring in childhood and adolescence: Secondary | ICD-10-CM

## 2021-10-27 DIAGNOSIS — E7800 Pure hypercholesterolemia, unspecified: Secondary | ICD-10-CM

## 2021-10-27 MED ORDER — HYDROXYZINE HCL 25 MG PO TABS
ORAL_TABLET | ORAL | 0 refills | Status: DC
  Filled 2021-10-27: qty 30, 30d supply, fill #0

## 2021-10-27 MED ORDER — AMPHETAMINE-DEXTROAMPHETAMINE 20 MG PO TABS
20.0000 mg | ORAL_TABLET | Freq: Two times a day (BID) | ORAL | 0 refills | Status: DC
  Filled 2021-11-03: qty 60, 30d supply, fill #0

## 2021-10-27 MED ORDER — RIZATRIPTAN BENZOATE 10 MG PO TABS
ORAL_TABLET | ORAL | 0 refills | Status: DC
  Filled 2021-10-27: qty 9, 30d supply, fill #0

## 2021-10-30 ENCOUNTER — Other Ambulatory Visit (HOSPITAL_BASED_OUTPATIENT_CLINIC_OR_DEPARTMENT_OTHER): Payer: Self-pay

## 2021-11-03 ENCOUNTER — Other Ambulatory Visit (HOSPITAL_BASED_OUTPATIENT_CLINIC_OR_DEPARTMENT_OTHER): Payer: Self-pay

## 2021-11-27 ENCOUNTER — Other Ambulatory Visit (HOSPITAL_BASED_OUTPATIENT_CLINIC_OR_DEPARTMENT_OTHER): Payer: Self-pay

## 2021-11-27 MED ORDER — INFLUENZA VAC SPLIT QUAD 0.5 ML IM SUSY
PREFILLED_SYRINGE | INTRAMUSCULAR | 0 refills | Status: DC
  Filled 2021-11-27: qty 0.5, 1d supply, fill #0

## 2022-02-12 ENCOUNTER — Other Ambulatory Visit: Payer: Self-pay

## 2022-02-12 ENCOUNTER — Other Ambulatory Visit: Payer: Self-pay | Admitting: Physician Assistant

## 2022-02-12 ENCOUNTER — Other Ambulatory Visit (HOSPITAL_BASED_OUTPATIENT_CLINIC_OR_DEPARTMENT_OTHER): Payer: Self-pay

## 2022-02-12 DIAGNOSIS — F988 Other specified behavioral and emotional disorders with onset usually occurring in childhood and adolescence: Secondary | ICD-10-CM

## 2022-02-13 ENCOUNTER — Other Ambulatory Visit (HOSPITAL_BASED_OUTPATIENT_CLINIC_OR_DEPARTMENT_OTHER): Payer: Self-pay

## 2022-02-13 MED ORDER — BUPROPION HCL ER (XL) 150 MG PO TB24
150.0000 mg | ORAL_TABLET | ORAL | 1 refills | Status: DC
  Filled 2022-02-13: qty 90, 90d supply, fill #0
  Filled 2022-06-16: qty 90, 90d supply, fill #1

## 2022-02-13 MED ORDER — RIZATRIPTAN BENZOATE 10 MG PO TABS
ORAL_TABLET | ORAL | 0 refills | Status: DC
  Filled 2022-02-13: qty 9, 15d supply, fill #0

## 2022-02-13 MED ORDER — HYDROXYZINE HCL 25 MG PO TABS
12.5000 mg | ORAL_TABLET | Freq: Every day | ORAL | 0 refills | Status: DC
  Filled 2022-02-13: qty 30, 30d supply, fill #0

## 2022-02-13 MED ORDER — AMPHETAMINE-DEXTROAMPHETAMINE 20 MG PO TABS
20.0000 mg | ORAL_TABLET | Freq: Two times a day (BID) | ORAL | 0 refills | Status: DC
  Filled 2022-02-13: qty 60, 30d supply, fill #0

## 2022-02-13 NOTE — Telephone Encounter (Signed)
Make sure to schedule appt.

## 2022-02-19 ENCOUNTER — Encounter (HOSPITAL_BASED_OUTPATIENT_CLINIC_OR_DEPARTMENT_OTHER): Payer: Self-pay

## 2022-02-19 ENCOUNTER — Other Ambulatory Visit (HOSPITAL_BASED_OUTPATIENT_CLINIC_OR_DEPARTMENT_OTHER): Payer: Self-pay

## 2022-04-27 ENCOUNTER — Other Ambulatory Visit (HOSPITAL_BASED_OUTPATIENT_CLINIC_OR_DEPARTMENT_OTHER): Payer: Self-pay

## 2022-05-31 ENCOUNTER — Other Ambulatory Visit (HOSPITAL_BASED_OUTPATIENT_CLINIC_OR_DEPARTMENT_OTHER): Payer: Self-pay

## 2022-06-16 ENCOUNTER — Other Ambulatory Visit: Payer: Self-pay | Admitting: Physician Assistant

## 2022-06-16 ENCOUNTER — Other Ambulatory Visit (HOSPITAL_BASED_OUTPATIENT_CLINIC_OR_DEPARTMENT_OTHER): Payer: Self-pay

## 2022-06-16 DIAGNOSIS — F3341 Major depressive disorder, recurrent, in partial remission: Secondary | ICD-10-CM

## 2022-06-16 DIAGNOSIS — F419 Anxiety disorder, unspecified: Secondary | ICD-10-CM

## 2022-06-16 DIAGNOSIS — E7800 Pure hypercholesterolemia, unspecified: Secondary | ICD-10-CM

## 2022-06-16 DIAGNOSIS — F988 Other specified behavioral and emotional disorders with onset usually occurring in childhood and adolescence: Secondary | ICD-10-CM

## 2022-06-18 ENCOUNTER — Other Ambulatory Visit (HOSPITAL_BASED_OUTPATIENT_CLINIC_OR_DEPARTMENT_OTHER): Payer: Self-pay

## 2022-06-18 ENCOUNTER — Other Ambulatory Visit: Payer: Self-pay

## 2022-06-18 ENCOUNTER — Other Ambulatory Visit: Payer: Self-pay | Admitting: Physician Assistant

## 2022-06-18 MED ORDER — RIZATRIPTAN BENZOATE 10 MG PO TABS
ORAL_TABLET | ORAL | 0 refills | Status: DC
  Filled 2022-06-18: qty 9, 30d supply, fill #0

## 2022-06-20 ENCOUNTER — Other Ambulatory Visit (HOSPITAL_BASED_OUTPATIENT_CLINIC_OR_DEPARTMENT_OTHER): Payer: Self-pay

## 2022-06-20 MED ORDER — SCOPOLAMINE 1 MG/3DAYS TD PT72
1.0000 | MEDICATED_PATCH | TRANSDERMAL | 0 refills | Status: DC
  Filled 2022-06-20: qty 10, 30d supply, fill #0

## 2022-06-20 MED ORDER — ROSUVASTATIN CALCIUM 10 MG PO TABS
10.0000 mg | ORAL_TABLET | Freq: Every day | ORAL | 3 refills | Status: DC
  Filled 2022-06-20: qty 90, 90d supply, fill #0
  Filled 2022-11-14: qty 90, 90d supply, fill #1
  Filled 2023-02-20: qty 90, 90d supply, fill #2
  Filled 2023-05-17: qty 90, 90d supply, fill #3

## 2022-06-20 MED ORDER — AMPHETAMINE-DEXTROAMPHETAMINE 20 MG PO TABS
20.0000 mg | ORAL_TABLET | Freq: Two times a day (BID) | ORAL | 0 refills | Status: DC
  Filled 2022-06-20: qty 60, 30d supply, fill #0

## 2022-06-20 MED ORDER — SERTRALINE HCL 50 MG PO TABS
ORAL_TABLET | ORAL | 3 refills | Status: DC
  Filled 2022-06-20: qty 135, 90d supply, fill #0
  Filled 2022-11-14: qty 135, 90d supply, fill #1
  Filled 2023-05-17: qty 135, 90d supply, fill #2

## 2022-06-20 MED ORDER — ALPRAZOLAM 0.25 MG PO TABS
ORAL_TABLET | ORAL | 0 refills | Status: DC
  Filled 2022-06-20: qty 30, 15d supply, fill #0

## 2022-07-04 ENCOUNTER — Telehealth (INDEPENDENT_AMBULATORY_CARE_PROVIDER_SITE_OTHER): Payer: 59 | Admitting: Physician Assistant

## 2022-07-04 ENCOUNTER — Other Ambulatory Visit (HOSPITAL_BASED_OUTPATIENT_CLINIC_OR_DEPARTMENT_OTHER): Payer: Self-pay

## 2022-07-04 DIAGNOSIS — F988 Other specified behavioral and emotional disorders with onset usually occurring in childhood and adolescence: Secondary | ICD-10-CM

## 2022-07-04 DIAGNOSIS — F3341 Major depressive disorder, recurrent, in partial remission: Secondary | ICD-10-CM | POA: Diagnosis not present

## 2022-07-04 DIAGNOSIS — G43009 Migraine without aura, not intractable, without status migrainosus: Secondary | ICD-10-CM | POA: Diagnosis not present

## 2022-07-04 DIAGNOSIS — F419 Anxiety disorder, unspecified: Secondary | ICD-10-CM | POA: Diagnosis not present

## 2022-07-04 MED ORDER — AMPHETAMINE-DEXTROAMPHETAMINE 20 MG PO TABS
20.0000 mg | ORAL_TABLET | Freq: Two times a day (BID) | ORAL | 0 refills | Status: DC
Start: 2022-07-04 — End: 2023-02-20
  Filled 2022-07-04 – 2022-08-10 (×2): qty 60, 30d supply, fill #0

## 2022-07-04 MED ORDER — AMPHETAMINE-DEXTROAMPHETAMINE 20 MG PO TABS
20.0000 mg | ORAL_TABLET | Freq: Two times a day (BID) | ORAL | 0 refills | Status: DC
Start: 2022-08-03 — End: 2023-08-23
  Filled 2022-11-14: qty 60, 30d supply, fill #0

## 2022-07-04 MED ORDER — RIZATRIPTAN BENZOATE 10 MG PO TABS
ORAL_TABLET | ORAL | 5 refills | Status: AC
Start: 2022-07-04 — End: ?
  Filled 2022-07-04: qty 9, fill #0
  Filled 2022-08-10: qty 9, 30d supply, fill #0
  Filled 2022-11-14: qty 9, 30d supply, fill #1
  Filled 2023-02-20: qty 9, 30d supply, fill #2
  Filled 2023-05-17: qty 9, 30d supply, fill #3

## 2022-07-04 MED ORDER — AMPHETAMINE-DEXTROAMPHETAMINE 20 MG PO TABS
20.0000 mg | ORAL_TABLET | Freq: Two times a day (BID) | ORAL | 0 refills | Status: DC
Start: 2022-09-03 — End: 2023-12-17

## 2022-07-04 MED ORDER — BUPROPION HCL ER (XL) 300 MG PO TB24
300.0000 mg | ORAL_TABLET | Freq: Every day | ORAL | 1 refills | Status: DC
Start: 2022-07-04 — End: 2023-02-20
  Filled 2022-07-04: qty 90, 90d supply, fill #0
  Filled 2022-11-14: qty 90, 90d supply, fill #1

## 2022-07-04 NOTE — Progress Notes (Signed)
An attempt to reach was made 3:19pm

## 2022-07-04 NOTE — Progress Notes (Deleted)
   Established Patient Office Visit  Subjective   Patient ID: [name], female    DOB: Apr 07, 1975  Age: 46 y.o. MRN: [id number]  No chief complaint on file.   HPI  Mood good.  1/2 adderall afternoon can't sleep at night.    {History (Optional):23778}  ROS    Objective:     LMP 12/21/2010  {Vitals History (Optional):23777}  Physical Exam   No results found for any visits on 07/04/22.  {Labs (Optional):23779}  The ASCVD Risk score (Arnett DK, et al., 2019) failed to calculate for the following reasons:   The systolic blood pressure is missing    Assessment & Plan:   Problem List Items Addressed This Visit   None   No follow-ups on file.    Tandy Gaw, PA-C

## 2022-07-06 ENCOUNTER — Encounter: Payer: Self-pay | Admitting: Physician Assistant

## 2022-07-06 NOTE — Progress Notes (Signed)
..Virtual Visit via Video Note  I connected with [name] on 07/06/22 at  3:00 PM EDT by a video enabled telemedicine application and verified that I am speaking with the correct person using two identifiers.  Location: Patient: work Provider: clinic  .Marland KitchenParticipating in visit:  Patient: [name] Provider: Tandy Gaw PA-C   I discussed the limitations of evaluation and management by telemedicine and the availability of in person appointments. The patient expressed understanding and agreed to proceed.  History of Present Illness: Pt calls into the clinic for medication refills.   She is doing well with mood. She switched jobs and very happy. Her focus is great in the morning but after lunch she really struggles to stay on task. She takes 1/2 tablet of 20mg  IR adderall in afternoon but at times keeps her up at night. Would like more focus in the afternoon.   .. 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   Recurrent major depressive disorder, in partial remission (HCC) 06/14/2021   Sexual dysfunction in female 06/14/2021   Resolved Ambulatory Problems    Diagnosis Date Noted   No Resolved Ambulatory Problems   Past Medical History:  Diagnosis Date   ADHD 1999   Colitis 2003   Dysautonomia (HCC) 1995   Exercise-induced asthma    Hyperlipidemia    IBS (irritable bowel syndrome) 2004   migraines 2000   Patellar dislocation 1997   Pericarditis 2021   PONV (postoperative nausea and vomiting)    Tumor 1997   Vaso-vagal reaction        Observations/Objective: No acute distress Normal mood and appearance   Assessment and  Plan: Marland KitchenMarland KitchenNaliya was seen today for follow-up.  Diagnoses and all orders for this visit:  Recurrent major depressive disorder, in partial remission (HCC) -     buPROPion (WELLBUTRIN XL) 300 MG 24 hr tablet; Take 1 tablet (300 mg total) by mouth daily.  Attention deficit disorder (ADD) without hyperactivity -     amphetamine-dextroamphetamine (ADDERALL) 20 MG tablet; Take 1 tablet (20 mg total) by mouth 2 (two) times daily. -     amphetamine-dextroamphetamine (ADDERALL) 20 MG tablet; Take 1 tablet (20 mg total) by mouth 2 (two) times daily. -     amphetamine-dextroamphetamine (ADDERALL) 20 MG tablet; Take 1 tablet (20 mg total) by mouth 2 (two) times daily. -     buPROPion (WELLBUTRIN XL) 300 MG 24 hr tablet; Take 1 tablet (300 mg total) by mouth daily.  Anxiety  Migraine without aura and without status migrainosus, not intractable -     rizatriptan (MAXALT) 10 MG tablet; Take 1 tablet by mouth as needed for migraine, may repeat in 2 hours if needed   Discussed options to get more focus in the afternoon Pt decided to try increase wellbutrin XL to 300mg  Consider adderall 20mg  and take 2 tablets in the morning  Could consider switching to XR adderall since patient has never tried    Follow Up Instructions:    I discussed the assessment and treatment plan with the patient. The patient was provided an opportunity to ask questions and all were answered. The patient agreed with the plan and demonstrated  an understanding of the instructions.   The patient was advised to call back or seek an in-person evaluation if the symptoms worsen or if the condition fails to improve as anticipated.   Tandy Gaw, PA-C

## 2022-07-11 ENCOUNTER — Encounter: Payer: 59 | Admitting: Physician Assistant

## 2022-08-10 ENCOUNTER — Other Ambulatory Visit (HOSPITAL_BASED_OUTPATIENT_CLINIC_OR_DEPARTMENT_OTHER): Payer: Self-pay

## 2022-08-10 ENCOUNTER — Encounter: Payer: Self-pay | Admitting: Physician Assistant

## 2022-08-10 MED ORDER — ONDANSETRON 8 MG PO TBDP
8.0000 mg | ORAL_TABLET | Freq: Three times a day (TID) | ORAL | 1 refills | Status: DC | PRN
  Filled 2022-08-10: qty 20, 7d supply, fill #0
  Filled 2022-11-14: qty 20, 7d supply, fill #1

## 2022-11-15 ENCOUNTER — Other Ambulatory Visit (HOSPITAL_BASED_OUTPATIENT_CLINIC_OR_DEPARTMENT_OTHER): Payer: Self-pay

## 2022-11-15 ENCOUNTER — Other Ambulatory Visit: Payer: Self-pay | Admitting: Physician Assistant

## 2022-11-15 ENCOUNTER — Other Ambulatory Visit: Payer: Self-pay

## 2022-11-15 DIAGNOSIS — Z1231 Encounter for screening mammogram for malignant neoplasm of breast: Secondary | ICD-10-CM

## 2022-12-12 ENCOUNTER — Ambulatory Visit
Admission: RE | Admit: 2022-12-12 | Discharge: 2022-12-12 | Disposition: A | Discharge status: Home / Self Care | Payer: 59 | Source: Ambulatory Visit

## 2022-12-12 DIAGNOSIS — Z1231 Encounter for screening mammogram for malignant neoplasm of breast: Secondary | ICD-10-CM | POA: Diagnosis not present

## 2022-12-14 NOTE — Progress Notes (Signed)
Normal mammogram

## 2023-01-17 DIAGNOSIS — S46012A Strain of muscle(s) and tendon(s) of the rotator cuff of left shoulder, initial encounter: Secondary | ICD-10-CM | POA: Diagnosis not present

## 2023-01-19 ENCOUNTER — Other Ambulatory Visit (HOSPITAL_BASED_OUTPATIENT_CLINIC_OR_DEPARTMENT_OTHER): Payer: Self-pay

## 2023-01-28 ENCOUNTER — Encounter: Payer: Self-pay | Admitting: Sports Medicine

## 2023-01-28 ENCOUNTER — Ambulatory Visit: Payer: 59 | Admitting: Sports Medicine

## 2023-01-28 DIAGNOSIS — M25512 Pain in left shoulder: Secondary | ICD-10-CM

## 2023-01-28 DIAGNOSIS — M25522 Pain in left elbow: Secondary | ICD-10-CM | POA: Diagnosis not present

## 2023-01-28 DIAGNOSIS — T148XXA Other injury of unspecified body region, initial encounter: Secondary | ICD-10-CM

## 2023-01-28 DIAGNOSIS — W19XXXD Unspecified fall, subsequent encounter: Secondary | ICD-10-CM | POA: Diagnosis not present

## 2023-01-28 NOTE — Progress Notes (Signed)
Patient says that she had a fall about 12 days ago off of the bed of a truck. She says that she initially landed on her left elbow and also hit the left shoulder. 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 She does not remember any popping with the fall. She says she did have a lot of bruising and swelling which have gone down, and she is able to move it more now than she did before. She says that she was seen at an Urgent Care where they gave her a steroid and Toradol injection that helped within a couple of hours.

## 2023-01-28 NOTE — Progress Notes (Signed)
ADDILYNE Coffey - 47 y.o. female MRN [id number]  Date of birth: 25-May-1975  Office Visit Note: Visit Date: 01/28/2023 PCP: Jomarie Longs, PA-C Referred by: Jomarie Longs, PA-C  Subjective: Chief Complaint  Patient presents with   Left Shoulder - Pain   Left Elbow - Pain   HPI: [name] is a pleasant 47 y.o. female who presents today for left shoulder and left elbow pain after a fall x 2 weeks.   She fell about 12 days ago off a truck and her foot caught the ladder and she landed on the posterior left shoulder and elbow. Had bruising and some swelling initially and painful ROM, but this has gotten somewhat better over the past week or so. When she fell she hit her elbow and had tingling and loss of sensation in the arm and fingers, but this has returned. 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 She is taking Advil/Motrin only as needed for her pain.  After her fall - she was seen at [facility] and had x-rays of both the shoulder and elbow, which were negative for fracture.  Pertinent ROS were reviewed with the patient and found to be negative unless otherwise specified above in HPI.   Assessment & Plan: Visit Diagnoses:  1. Acute pain of left shoulder   2. Left elbow pain   3. Contusion of bone   4. Fall, subsequent encounter    Plan: Impression is soft tissue injury and likely bone contusion of the spine of scapula/posterior shoulder from fall. She did experience a degree of ulnar neuritis/neuropraxia from the direct impact on the elbow, but this is improving. X-ray's from outside facility demonstrated no fracture, do not feel we need to repeat today. Would like her to avoid stiffness in the shoulder and elbow - did provide handout and my ATC, Lydia, did review some range of motion exercises for the shoulder and elbow today, which SaraBeth should perform 1-2x daily. Would expect her pain to resolve over the next 6 weeks or so. She will  call/message me if it does not, and we can consider further evaluation vs. MRI, otherwise will f/u PRN. Ok for OTC- anti-inflammatories as needed.   Follow-up: Return in about 6 weeks (around 03/11/2023), or if symptoms worsen or fail to improve, for if not > 90% improved.   Meds & Orders: No orders of the defined types were placed in this encounter.  No orders of the defined types were placed in this encounter.    Procedures: No procedures performed      Clinical History: No specialty comments available.  She reports that she has never smoked. She has never used smokeless tobacco. No results for input(s): "HGBA1C", "LABURIC" in the last 8760 hours.  Objective:   Vital Signs: LMP 12/21/2010   Physical Exam  Gen: Well-appearing, in no acute distress; non-toxic CV: Well-perfused. Warm.  Resp: Breathing unlabored on room air; no wheezing. Psych: Fluid speech in conversation; appropriate affect; normal thought process  Ortho Exam - Left shoulder: + TTP over the posterior aspect of the acromion/spine of scapula. No scapular winging or dysfunction. There is near FROM but pain with end-range flexion/abduction. Some pain with resisted Abd/empty can, but no gross weakness. Good strength with Ext/Int testing. Mild soft tissue swelling over posterior shoulder/SS.  - Left elbow: Mild soft tissue swelling over posterior elbow near olecranon, some TTP at this area. No med/lat epicondyle tenderness. Full ROM  in flexion, there is hesitancy with full extension from about 5-8 degrees 2/2 to pain. No varus/valgus instability. Negative Tinel's at cubital tunnel.  Imaging:  - Reported negative XR of elbow and shoulder at [facility]  Past Medical/Family/Surgical/Social History: Medications & Allergies reviewed per EMR, new medications updated. 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   History of pericarditis 02/09/2020   Dyslipidemia 02/09/2020   Lupus 02/09/2020   Knee pain, left 04/20/2010   Patellofemoral pain syndrome 04/20/2010   Gait abnormality 04/20/2010   Depression 10/15/1994   Anxiety 10/15/1994   Past Medical History:  Diagnosis Date   ADHD 1999   Anxiety 1999   Colitis 2003   Depression 1997   Dysautonomia (HCC) 1995   Exercise-induced asthma    Hyperlipidemia    IBS (irritable bowel syndrome) 2004   Lupus 2003   controlled-no issues   migraines 2000   Patellar dislocation 1997   traumatic, hit in knee by metal baton   Pericarditis 2021   PONV (postoperative nausea and vomiting)    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   Vaso-vagal reaction    Family History  Problem Relation Age of Onset   CAD Mother    Breast cancer Mother        x2 HER 2+ ER/PR-  Gwenyth Bender ER/PR+   Non-Hodgkin's lymphoma Mother    Lupus Mother    Hypothyroidism Mother    Raynaud syndrome Mother    Sjogren's syndrome Mother    Hyperlipidemia Mother    Migraines Mother    CAD Father        sudden cardiac death at 28 yo   Heart attack Father    Hypertension Father    Alcoholism Father    Sleep apnea Father    Obesity Sister    Asthma Sister    Migraines Sister    Dementia Maternal Grandmother    CAD Maternal Grandmother    Stroke Maternal Grandmother    Aneurysm Maternal Grandmother        carotid   Lung cancer Maternal Grandfather    Prostate cancer Maternal Grandfather    CAD Maternal Grandfather    Hypertension Maternal Grandfather    Alcoholism Maternal Grandfather    Stroke Paternal Grandmother    Hypertension Paternal Grandmother    CAD Paternal Grandmother    Heart attack Paternal Grandmother    Diabetes Paternal Grandmother    Cirrhosis Paternal Grandfather    Alcoholism Paternal Grandfather    Obesity Paternal Grandfather    Anxiety disorder  Daughter    Depression Daughter    ADD / ADHD Daughter    Cerebral palsy Daughter    Other Daughter        PFO   Anxiety disorder Daughter    Depression Daughter    Past Surgical History:  Procedure Laterality Date   ABDOMINAL HYSTERECTOMY     DILATION AND CURETTAGE OF UTERUS  2007   ablation   TUBAL LIGATION  2004   TUMOR REMOVAL  98   left ulnar nerve tumor   WISDOM TOOTH EXTRACTION  58   Social History   Occupational History    Comment: Nurse  Tobacco Use   Smoking status: Never   Smokeless tobacco: Never  Substance and Sexual Activity   Alcohol  use: Yes    Comment: Occasional   Drug use: No   Sexual activity: Not on file

## 2023-02-20 ENCOUNTER — Other Ambulatory Visit: Payer: Self-pay | Admitting: Physician Assistant

## 2023-02-20 ENCOUNTER — Other Ambulatory Visit (HOSPITAL_BASED_OUTPATIENT_CLINIC_OR_DEPARTMENT_OTHER): Payer: Self-pay

## 2023-02-20 ENCOUNTER — Telehealth: Payer: Commercial Managed Care - PPO | Admitting: Physician Assistant

## 2023-02-20 ENCOUNTER — Other Ambulatory Visit: Payer: Self-pay

## 2023-02-20 VITALS — BP 108/70 | Ht 67.0 in | Wt 150.0 lb

## 2023-02-20 DIAGNOSIS — F988 Other specified behavioral and emotional disorders with onset usually occurring in childhood and adolescence: Secondary | ICD-10-CM

## 2023-02-20 DIAGNOSIS — J40 Bronchitis, not specified as acute or chronic: Secondary | ICD-10-CM | POA: Diagnosis not present

## 2023-02-20 DIAGNOSIS — R21 Rash and other nonspecific skin eruption: Secondary | ICD-10-CM | POA: Diagnosis not present

## 2023-02-20 DIAGNOSIS — J329 Chronic sinusitis, unspecified: Secondary | ICD-10-CM

## 2023-02-20 DIAGNOSIS — F3341 Major depressive disorder, recurrent, in partial remission: Secondary | ICD-10-CM

## 2023-02-20 MED ORDER — BUPROPION HCL ER (XL) 300 MG PO TB24
300.0000 mg | ORAL_TABLET | Freq: Every day | ORAL | 1 refills | Status: DC
  Filled 2023-02-20: qty 90, 90d supply, fill #0
  Filled 2023-08-22: qty 90, 90d supply, fill #1

## 2023-02-20 MED ORDER — PREDNISONE 20 MG PO TABS
ORAL_TABLET | ORAL | 0 refills | Status: DC
  Filled 2023-02-20: qty 20, 13d supply, fill #0

## 2023-02-20 MED ORDER — AMOXICILLIN-POT CLAVULANATE 875-125 MG PO TABS
1.0000 | ORAL_TABLET | Freq: Two times a day (BID) | ORAL | 0 refills | Status: DC
  Filled 2023-02-20: qty 20, 10d supply, fill #0

## 2023-02-20 NOTE — Progress Notes (Signed)
..  Virtual Visit via Video Note  I connected with Seydi Fredericksen Nawabi on 02/20/23 at  9:50 AM EST by a video enabled telemedicine application and verified that I am speaking with the correct person using two identifiers.  Location: Patient: work Provider: clinic  .Marland KitchenParticipating in visit:  Patient: Clay Alviar Provider: Tandy Gaw PA-C Provider in training: Gareth Eagle PA-S   I discussed the limitations of evaluation and management by telemedicine and the availability of in person appointments. The patient expressed understanding and agreed to proceed.  History of Present Illness: Pt calls into the clinic with URI symptoms for the last 2 weeks. She thought she was getting better but then got worse again. She tested negative for flu and covid twice. 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 She also has a rash over trunk, thigh, arms for the last week. It is very itching. Took 100mg  of hydroxyzine with no improvement. She is taking OTC medications for cough, sinus pressure, ear pressure, ST.   Not changed any medications, soaps, detergents.   .. 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   Recurrent major depressive disorder, in partial remission (HCC) 06/14/2021   Sexual dysfunction in female 06/14/2021   Resolved Ambulatory Problems    Diagnosis Date Noted   No Resolved Ambulatory Problems   Past Medical History:  Diagnosis Date   ADHD 1999   Colitis 2003   Dysautonomia (HCC) 1995   Exercise-induced asthma    Hyperlipidemia    IBS (irritable bowel syndrome) 2004   migraines 2000   Patellar dislocation 1997   Pericarditis 2021   PONV (postoperative  nausea and vomiting)    Tumor 1997   Vaso-vagal reaction        Observations/Objective: No acute distress Normal mood and appearance Productive cough Maculopapular rash on anterior trunk   Assessment and Plan: Marland KitchenMarland KitchenTakelia "SaraBeth" was seen today for cough.  Diagnoses and all orders for this visit:  Sinobronchitis -     predniSONE (DELTASONE) 20 MG tablet; Take 3 tablets by mouth for 3 days, take 2 tablets for 3 days, take 1 tablet for 3 days, take 1/2 tablet for 4 days. -     amoxicillin-clavulanate (AUGMENTIN) 875-125 MG tablet; Take 1 tablet by mouth 2 (two) times daily.  Rash and nonspecific skin eruption -     predniSONE (DELTASONE) 20 MG tablet; Take 3 tablets by mouth for 3 days, take 2 tablets for 3 days, take 1 tablet for 3 days, take 1/2 tablet for 4 days.   Unclear etiology of rash, likely viral  Start augmentin and prednisone Continue symptomatic care with hydration, rest and OTC cold and cough medications Follow up as needed if symptoms persist or worsen   Follow Up Instructions:    I discussed the assessment and treatment plan with the patient. The patient was provided an opportunity to ask questions and all were answered. The patient agreed with the plan and demonstrated an understanding of the instructions.   The patient was advised to call back or seek an in-person evaluation if the symptoms worsen or if the condition fails to improve as anticipated.   Tandy Gaw, PA-C

## 2023-02-22 ENCOUNTER — Other Ambulatory Visit (HOSPITAL_BASED_OUTPATIENT_CLINIC_OR_DEPARTMENT_OTHER): Payer: Self-pay

## 2023-02-22 MED ORDER — AMPHETAMINE-DEXTROAMPHETAMINE 20 MG PO TABS: 20.0000 mg | ORAL_TABLET | Freq: Two times a day (BID) | ORAL | 0 refills | Status: DC

## 2023-02-22 MED ORDER — AMPHETAMINE-DEXTROAMPHETAMINE 20 MG PO TABS
20.0000 mg | ORAL_TABLET | Freq: Two times a day (BID) | ORAL | 0 refills | Status: DC
  Filled 2023-05-17: qty 60, 30d supply, fill #0

## 2023-02-22 MED ORDER — AMPHETAMINE-DEXTROAMPHETAMINE 20 MG PO TABS
20.0000 mg | ORAL_TABLET | Freq: Two times a day (BID) | ORAL | 0 refills | Status: DC
  Filled 2023-02-22: qty 60, 30d supply, fill #0

## 2023-02-28 ENCOUNTER — Other Ambulatory Visit (HOSPITAL_BASED_OUTPATIENT_CLINIC_OR_DEPARTMENT_OTHER): Payer: Self-pay

## 2023-02-28 ENCOUNTER — Telehealth: Payer: Self-pay | Admitting: Family Medicine

## 2023-02-28 MED ORDER — FLUCONAZOLE 150 MG PO TABS
150.0000 mg | ORAL_TABLET | Freq: Every day | ORAL | 1 refills | Status: DC
  Filled 2023-02-28: qty 1, 1d supply, fill #0

## 2023-02-28 NOTE — Telephone Encounter (Signed)
On ABX rx by my coworker, and now has yeast infxn.   Send in diflucan  Meds ordered this encounter  Medications   fluconazole (DIFLUCAN) 150 MG tablet    Sig: Take 1 tablet (150 mg total) by mouth daily.    Dispense:  1 tablet    Refill:  1

## 2023-03-05 ENCOUNTER — Encounter (INDEPENDENT_AMBULATORY_CARE_PROVIDER_SITE_OTHER): Payer: Self-pay

## 2023-03-06 ENCOUNTER — Telehealth: Payer: Commercial Managed Care - PPO | Admitting: Physician Assistant

## 2023-03-06 ENCOUNTER — Other Ambulatory Visit (HOSPITAL_BASED_OUTPATIENT_CLINIC_OR_DEPARTMENT_OTHER): Payer: Self-pay

## 2023-03-06 VITALS — Ht 67.0 in | Wt 145.0 lb

## 2023-03-06 DIAGNOSIS — L42 Pityriasis rosea: Secondary | ICD-10-CM | POA: Diagnosis not present

## 2023-03-06 DIAGNOSIS — L299 Pruritus, unspecified: Secondary | ICD-10-CM | POA: Diagnosis not present

## 2023-03-06 MED ORDER — PREDNISONE 20 MG PO TABS
ORAL_TABLET | ORAL | 0 refills | Status: AC
  Filled 2023-03-06: qty 28, 28d supply, fill #0

## 2023-03-06 MED ORDER — TRIAMCINOLONE ACETONIDE 0.1 % EX CREA
1.0000 | TOPICAL_CREAM | Freq: Two times a day (BID) | CUTANEOUS | 0 refills | Status: DC
  Filled 2023-03-06: qty 80, 30d supply, fill #0

## 2023-03-06 MED ORDER — VALACYCLOVIR HCL 1 G PO TABS
1000.0000 mg | ORAL_TABLET | Freq: Two times a day (BID) | ORAL | 0 refills | Status: DC
  Filled 2023-03-06: qty 20, 10d supply, fill #0

## 2023-03-08 ENCOUNTER — Encounter: Payer: Self-pay | Admitting: Physician Assistant

## 2023-03-08 DIAGNOSIS — L299 Pruritus, unspecified: Secondary | ICD-10-CM | POA: Insufficient documentation

## 2023-03-08 DIAGNOSIS — L42 Pityriasis rosea: Secondary | ICD-10-CM | POA: Insufficient documentation

## 2023-03-08 NOTE — Progress Notes (Signed)
 ..Virtual Visit via Video Note  I connected with [name] on 03/08/23 at  1:40 PM EST by a video enabled telemedicine application and verified that I am speaking with the correct person using two identifiers.  Location: Patient: work Provider: clinic   .SABRAParticipating in visit:  Patient: [name] Provider: Vermell Bologna PA-C   I discussed the limitations of evaluation and management by telemedicine and the availability of in person appointments. The patient expressed understanding and agreed to proceed.  History of Present Illness: Pt is a 48 yo female who calls into the clinic to follow up on rash. Her sinus and bronchitis symptoms have resolved but she continues to have rash that seems to be consistent with pityriasis rosea. She has actually had this once before when she was younger. Herald patch is under right axilla. She is extremely itchy. Prednisone  at 20mg  keeps her from itching but when she decreased down to 10mg  the itching started back up. She is taking zyrtec and hydroxyzine  for itching as well. Rash for last 4 weeks.   .. 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   Recurrent major depressive disorder, in partial remission (HCC) 06/14/2021   Sexual dysfunction in female 06/14/2021   Pityriasis rosea 03/08/2023   Itching 03/08/2023   Resolved Ambulatory Problems    Diagnosis Date Noted   No Resolved Ambulatory Problems   Past Medical History:  Diagnosis Date   ADHD 1999   Colitis 2003   Dysautonomia (HCC) 1995   Exercise-induced asthma    Hyperlipidemia    IBS (irritable bowel syndrome) 2004   migraines 2000   Patellar  dislocation 1997   Pericarditis 2021   PONV (postoperative nausea and vomiting)    Tumor 1997   Vaso-vagal reaction        Observations/Objective: No acute distress Normal breathing Normal appearance-rash improving herald patch just under right axilla. Diffuse macular papular rash over trunk seems to be fading some   Assessment and Plan: SABRASABRAQuanisha Drewry was seen today for rash.  Diagnoses and all orders for this visit:  Pityriasis rosea -     predniSONE  (DELTASONE ) 20 MG tablet; Take 1 tablet (20 mg total) by mouth daily for 14 days, THEN 0.5 tablets (10 mg total) daily for 14 days. -     triamcinolone  cream (KENALOG ) 0.1 %; Apply topically to affected area(s) 2 (two) times daily. -     Ambulatory referral to Dermatology -     valACYclovir  (VALTREX ) 1000 MG tablet; Take 1 tablet (1,000 mg total) by mouth 2 (two) times daily for 10 days.  Itching -     predniSONE  (DELTASONE ) 20 MG tablet; Take 1 tablet (20 mg total) by mouth daily for 14 days, THEN 0.5 tablets (10 mg total) daily for 14 days. -     triamcinolone  cream (KENALOG ) 0.1 %; Apply topically to affected area(s) 2 (two) times daily. -     Ambulatory referral to Dermatology   Discussed timeline for rash and itching Increase back up to prednisone  20mg  for up to 2 weeks then decrease to 10mg  then 5mg  then stop Continue zytrec and hydroxyzine  Triamcinolone  for topical use on worse areas of  rash or itching Valtrex  due to some research with connection of HSV and PR Consider tanning bed 5 minutes twice a week due to research on UVA rays and improving itching associated with PR Referral to dermatology just in case not improving or worsening    Follow Up Instructions:    I discussed the assessment and treatment plan with the patient. The patient was provided an opportunity to ask questions and all were answered. The patient agreed with the plan and demonstrated an understanding of the instructions.   The patient was  advised to call back or seek an in-person evaluation if the symptoms worsen or if the condition fails to improve as anticipated.   Story Vanvranken, PA-C

## 2023-03-13 ENCOUNTER — Encounter: Payer: Self-pay | Admitting: Physician Assistant

## 2023-03-13 ENCOUNTER — Ambulatory Visit (INDEPENDENT_AMBULATORY_CARE_PROVIDER_SITE_OTHER): Payer: Commercial Managed Care - PPO | Admitting: Physician Assistant

## 2023-03-13 VITALS — BP 129/90 | HR 35 | Ht 67.0 in | Wt 149.0 lb

## 2023-03-13 DIAGNOSIS — Z131 Encounter for screening for diabetes mellitus: Secondary | ICD-10-CM

## 2023-03-13 DIAGNOSIS — Z1211 Encounter for screening for malignant neoplasm of colon: Secondary | ICD-10-CM

## 2023-03-13 DIAGNOSIS — Z1159 Encounter for screening for other viral diseases: Secondary | ICD-10-CM | POA: Diagnosis not present

## 2023-03-13 DIAGNOSIS — Z114 Encounter for screening for human immunodeficiency virus [HIV]: Secondary | ICD-10-CM

## 2023-03-13 DIAGNOSIS — Z13220 Encounter for screening for lipoid disorders: Secondary | ICD-10-CM | POA: Diagnosis not present

## 2023-03-13 DIAGNOSIS — Z0000 Encounter for general adult medical examination without abnormal findings: Secondary | ICD-10-CM

## 2023-03-13 NOTE — Patient Instructions (Signed)
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
## 2023-03-14 LAB — CMP14+EGFR
ALT: 15 [IU]/L (ref 0–32)
AST: 12 [IU]/L (ref 0–40)
Albumin: 4 g/dL (ref 3.9–4.9)
Alkaline Phosphatase: 67 [IU]/L (ref 44–121)
BUN/Creatinine Ratio: 10 (ref 9–23)
BUN: 8 mg/dL (ref 6–24)
Bilirubin Total: 0.4 mg/dL (ref 0.0–1.2)
CO2: 21 mmol/L (ref 20–29)
Calcium: 9.6 mg/dL (ref 8.7–10.2)
Chloride: 104 mmol/L (ref 96–106)
Creatinine, Ser: 0.84 mg/dL (ref 0.57–1.00)
Globulin, Total: 2.1 g/dL (ref 1.5–4.5)
Glucose: 82 mg/dL (ref 70–99)
Potassium: 3.9 mmol/L (ref 3.5–5.2)
Sodium: 143 mmol/L (ref 134–144)
Total Protein: 6.1 g/dL (ref 6.0–8.5)
eGFR: 87 mL/min/{1.73_m2} (ref >59)

## 2023-03-14 LAB — LIPID PANEL
Chol/HDL Ratio: 2.2 {ratio} (ref 0.0–4.4)
Cholesterol, Total: 161 mg/dL (ref 100–199)
HDL: 74 mg/dL (ref >39)
LDL Chol Calc (NIH): 75 mg/dL (ref 0–99)
Triglycerides: 60 mg/dL (ref 0–149)
VLDL Cholesterol Cal: 12 mg/dL (ref 5–40)

## 2023-03-14 LAB — CBC WITH DIFFERENTIAL/PLATELET
Basophils Absolute: 0 10*3/uL (ref 0.0–0.2)
Basos: 0 %
EOS (ABSOLUTE): 0.1 10*3/uL (ref 0.0–0.4)
Eos: 1 %
Hematocrit: 39 % (ref 34.0–46.6)
Hemoglobin: 12.7 g/dL (ref 11.1–15.9)
Immature Grans (Abs): 0.1 10*3/uL (ref 0.0–0.1)
Immature Granulocytes: 1 %
Lymphocytes Absolute: 2.1 10*3/uL (ref 0.7–3.1)
Lymphs: 26 %
MCH: 31.8 pg (ref 26.6–33.0)
MCHC: 32.6 g/dL (ref 31.5–35.7)
MCV: 98 fL — ABNORMAL HIGH (ref 79–97)
Monocytes Absolute: 0.7 10*3/uL (ref 0.1–0.9)
Monocytes: 8 %
Neutrophils Absolute: 5.3 10*3/uL (ref 1.4–7.0)
Neutrophils: 64 %
Platelets: 295 10*3/uL (ref 150–450)
RBC: 3.99 x10E6/uL (ref 3.77–5.28)
RDW: 12.7 % (ref 11.7–15.4)
WBC: 8.2 10*3/uL (ref 3.4–10.8)

## 2023-03-14 LAB — HEPATITIS C ANTIBODY: Hep C Virus Ab: NONREACTIVE

## 2023-03-14 LAB — HEMOGLOBIN A1C
Est. average glucose Bld gHb Est-mCnc: 114 mg/dL
Hgb A1c MFr Bld: 5.6 % (ref 4.8–5.6)

## 2023-03-14 LAB — TSH: TSH: 2.11 u[IU]/mL (ref 0.450–4.500)

## 2023-03-14 LAB — HIV ANTIBODY (ROUTINE TESTING W REFLEX): HIV Screen 4th Generation wRfx: NONREACTIVE

## 2023-03-14 LAB — VITAMIN D 25 HYDROXY (VIT D DEFICIENCY, FRACTURES): Vit D, 25-Hydroxy: 27.3 ng/mL — ABNORMAL LOW (ref 30.0–100.0)

## 2023-03-15 ENCOUNTER — Encounter: Payer: Self-pay | Admitting: Physician Assistant

## 2023-03-15 NOTE — Progress Notes (Signed)
[name],   Vitamin d not to goal. Make sure taking at least 2000 units with dairy for better absorption.   Thyroid looks good.   Cholesterol looks great!   Kidney and liver look great.   A1C close to pre-diabetes range. Could be all the prednisone. Will continue to watch this. Recheck in 6 months. 150 minutes of exercise a week and limit sugars/carbs/processed foods.

## 2023-04-02 ENCOUNTER — Other Ambulatory Visit (HOSPITAL_BASED_OUTPATIENT_CLINIC_OR_DEPARTMENT_OTHER): Payer: Self-pay

## 2023-05-17 ENCOUNTER — Other Ambulatory Visit: Payer: Self-pay

## 2023-05-17 ENCOUNTER — Other Ambulatory Visit (HOSPITAL_BASED_OUTPATIENT_CLINIC_OR_DEPARTMENT_OTHER): Payer: Self-pay

## 2023-08-22 ENCOUNTER — Other Ambulatory Visit: Payer: Self-pay | Admitting: Physician Assistant

## 2023-08-22 DIAGNOSIS — F988 Other specified behavioral and emotional disorders with onset usually occurring in childhood and adolescence: Secondary | ICD-10-CM

## 2023-08-22 DIAGNOSIS — L42 Pityriasis rosea: Secondary | ICD-10-CM

## 2023-08-22 DIAGNOSIS — E7800 Pure hypercholesterolemia, unspecified: Secondary | ICD-10-CM

## 2023-08-22 DIAGNOSIS — L299 Pruritus, unspecified: Secondary | ICD-10-CM

## 2023-08-23 ENCOUNTER — Other Ambulatory Visit (HOSPITAL_BASED_OUTPATIENT_CLINIC_OR_DEPARTMENT_OTHER): Payer: Self-pay

## 2023-08-23 ENCOUNTER — Other Ambulatory Visit: Payer: Self-pay

## 2023-08-23 MED ORDER — ONDANSETRON 8 MG PO TBDP
8.0000 mg | ORAL_TABLET | Freq: Three times a day (TID) | ORAL | 1 refills | Status: DC | PRN
  Filled 2023-08-23: qty 20, 7d supply, fill #0

## 2023-08-23 MED ORDER — TRIAMCINOLONE ACETONIDE 0.1 % EX CREA
1.0000 | TOPICAL_CREAM | Freq: Two times a day (BID) | CUTANEOUS | 0 refills | Status: AC
  Filled 2023-08-23: qty 80, 40d supply, fill #0

## 2023-08-23 MED ORDER — ROSUVASTATIN CALCIUM 10 MG PO TABS
10.0000 mg | ORAL_TABLET | Freq: Every day | ORAL | 3 refills | Status: AC
  Filled 2023-08-23: qty 90, 90d supply, fill #0
  Filled 2023-12-23: qty 90, 90d supply, fill #1

## 2023-08-23 MED ORDER — AMPHETAMINE-DEXTROAMPHETAMINE 20 MG PO TABS
20.0000 mg | ORAL_TABLET | Freq: Two times a day (BID) | ORAL | 0 refills | Status: DC
  Filled 2023-08-23: qty 180, 90d supply, fill #0

## 2023-10-03 ENCOUNTER — Ambulatory Visit: Payer: Commercial Managed Care - PPO | Admitting: Dermatology

## 2023-10-03 ENCOUNTER — Encounter: Payer: Self-pay | Admitting: Dermatology

## 2023-10-03 VITALS — BP 121/84

## 2023-10-03 DIAGNOSIS — L42 Pityriasis rosea: Secondary | ICD-10-CM | POA: Diagnosis not present

## 2023-10-03 DIAGNOSIS — Z7189 Other specified counseling: Secondary | ICD-10-CM

## 2023-10-03 NOTE — Patient Instructions (Addendum)
 Date: Thu Oct 03 2023  Hello Manzi,  Thank you for visiting today. Here is a summary of the key instructions:  - Skin Care:   - Use CeraVe moisturizer with SPF daily on face, neck, and chest   - Moisturize whole body every day after showering   - Try La Roche-Posay moisturizer with urea for whole body   - Apply sunscreen daily, especially on face, neck, and chest   - Remember to protect skin while driving  - Other Instructions:   - Continue regular self-checks for any new or changing skin spots   - Contact the office if you notice any concerning changes before your next appointment  - Follow-up:   - Schedule a skin check appointment, as it has been years since your last one   - Use MyChart for fastest communication with the office if needed  Please reach out if you have any questions or concerns.  Warm regards,  Dr. Delon [facility]  Important Information  Due to recent changes in healthcare laws, you may see results of your pathology and/or laboratory studies on MyChart before the doctors have had a chance to review them. We understand that in some cases there may be results that are confusing or concerning to you. Please understand that not all results are received at the same time and often the doctors may need to interpret multiple results in order to provide you with the best plan of care or course of treatment. Therefore, we ask that you please give us  2 business days to thoroughly review all your results before contacting the office for clarification. Should we see a critical lab result, you will be contacted sooner.   If You Need Anything After Your Visit  If you have any questions or concerns for your doctor, please call our main line at [phone] If no one answers, please leave a voicemail as directed and we will return your call as soon as possible. Messages left after 4 pm will be answered the following business day.   You may also send us  a message  via MyChart. We typically respond to MyChart messages within 1-2 business days.  For prescription refills, please ask your pharmacy to contact our office. Our fax number is [phone].  If you have an urgent issue when the clinic is closed that cannot wait until the next business day, you can page your doctor at the number below.    Please note that while we do our best to be available for urgent issues outside of office hours, we are not available 24/7.   If you have an urgent issue and are unable to reach us , you may choose to seek medical care at your doctor's office, retail clinic, urgent care center, or emergency room.  If you have a medical emergency, please immediately call 911 or go to the emergency department. In the event of inclement weather, please call our main line at [phone] for an update on the status of any delays or closures.  Dermatology Medication Tips: Please keep the boxes that topical medications come in in order to help keep track of the instructions about where and how to use these. Pharmacies typically print the medication instructions only on the boxes and not directly on the medication tubes.   If your medication is too expensive, please contact our office at [phone] or send us  a message through MyChart.   We are unable to tell what your co-pay for medications will be in advance  as this is different depending on your insurance coverage. However, we may be able to find a substitute medication at lower cost or fill out paperwork to get insurance to cover a needed medication.   If a prior authorization is required to get your medication covered by your insurance company, please allow us  1-2 business days to complete this process.  Drug prices often vary depending on where the prescription is filled and some pharmacies may offer cheaper prices.  The website www.goodrx.com contains coupons for medications through different pharmacies. The prices here do not  account for what the cost may be with help from insurance (it may be cheaper with your insurance), but the website can give you the price if you did not use any insurance.  - You can print the associated coupon and take it with your prescription to the pharmacy.  - You may also stop by our office during regular business hours and pick up a GoodRx coupon card.  - If you need your prescription sent electronically to a different pharmacy, notify our office through [facility] or by phone at [phone]

## 2023-10-03 NOTE — Progress Notes (Signed)
   New Patient Visit   Subjective  [name] is a 48 y.o. female who presents for a NEW PATIENT appointment to be examined for the concerns as listed below.   Pityriasis Rosea : Resolved but presented at the chest, B/L upper thighs and B/L arms that appeared in January and cleared in May. She stated that she has been Dx with this condition 4x over her life. During flares she was on prednisone  20mg  daily for 3mo & TMC for extreme problem areas which these 2 were effective. She has also tried and failed zyrtec, xyzal, benadryl, hydroxyzine , calamine lotion, famotidine & topical mometasone.   Are you nursing, pregnant or trying to conceive? No   Patient reports Hx of Bx - benign. Patient reports family Hx of skin cancer (maternal grandmother - SCC, maternal uncle - melanoma).   The following portions of the chart were reviewed this encounter and updated as appropriate: medications, allergies, medical history  Review of Systems:  No other skin or systemic complaints except as noted in HPI or Assessment and Plan.  Objective  Well appearing patient in no apparent distress; mood and affect are within normal limits.   A focused examination was performed of the following areas: chest, B/L upper thighs and B/L arms   Relevant exam findings are noted in the Assessment and Plan.    Assessment & Plan   1. History of Pityriasis Rosea with Recent Exacerbation - Assessment: Patient reports history of pityriasis rosea in childhood and [place] adulthood with recent recurrence presenting as flat-topped papules with fine scale. Condition progressed to severe pruritus affecting arms and thighs, resulting in bruising from scratching. Presentation evolved into pinpoint macules and occasional papules. Episode may have progressed to id reaction or exanthematous reaction presenting as secondary eczematous eruption. Episode lasted until May of current year, skin is currently clear. - Plan:    Continue  to monitor for any recurrence or new skin changes    Patient to perform regular self-skin examinations    UVB light therapy was effective for previous episode  2. Skin Cancer Prevention - Assessment: Patient has significant family history of melanoma with maternal uncle who passed away from condition at approximately 47 years of age. Patient reports having had benign skin biopsies in past. Last professional skin check was several years ago indicating need for updated comprehensive skin cancer screening. - Plan:    Schedule comprehensive skin cancer screening    Continue daily use of CeraVe moisturizer with SPF    Educate on proper application of sunscreen to face, neck, and chest    Recommend whole body moisturizer La Roche-Posay with urea for hydration and exfoliation    Encourage patient to report any new or changing skin lesions promptly via MyChart     Return if symptoms worsen or fail to improve, for TBSE.   Documentation: I have reviewed the above documentation for accuracy and completeness, and I agree with the above.  I, Shirron Maranda, CMA, am acting as scribe for [employer], DO.   Delon Lenis, DO

## 2023-12-02 ENCOUNTER — Encounter: Payer: Self-pay | Admitting: Radiology

## 2023-12-17 ENCOUNTER — Encounter: Payer: Self-pay | Admitting: Physician Assistant

## 2023-12-17 ENCOUNTER — Telehealth: Admitting: Physician Assistant

## 2023-12-17 ENCOUNTER — Other Ambulatory Visit: Payer: Self-pay

## 2023-12-17 ENCOUNTER — Other Ambulatory Visit (HOSPITAL_BASED_OUTPATIENT_CLINIC_OR_DEPARTMENT_OTHER): Payer: Self-pay

## 2023-12-17 VITALS — BP 130/75

## 2023-12-17 DIAGNOSIS — F32A Depression, unspecified: Secondary | ICD-10-CM | POA: Diagnosis not present

## 2023-12-17 DIAGNOSIS — T753XXA Motion sickness, initial encounter: Secondary | ICD-10-CM | POA: Insufficient documentation

## 2023-12-17 DIAGNOSIS — F40243 Fear of flying: Secondary | ICD-10-CM

## 2023-12-17 DIAGNOSIS — T753XXD Motion sickness, subsequent encounter: Secondary | ICD-10-CM | POA: Diagnosis not present

## 2023-12-17 DIAGNOSIS — F988 Other specified behavioral and emotional disorders with onset usually occurring in childhood and adolescence: Secondary | ICD-10-CM

## 2023-12-17 DIAGNOSIS — Z7184 Encounter for health counseling related to travel: Secondary | ICD-10-CM | POA: Insufficient documentation

## 2023-12-17 DIAGNOSIS — F3341 Major depressive disorder, recurrent, in partial remission: Secondary | ICD-10-CM

## 2023-12-17 MED ORDER — ONDANSETRON 8 MG PO TBDP
8.0000 mg | ORAL_TABLET | Freq: Three times a day (TID) | ORAL | 1 refills | Status: AC | PRN
  Filled 2023-12-17: qty 20, 7d supply, fill #0

## 2023-12-17 MED ORDER — SCOPOLAMINE 1 MG/3DAYS TD PT72
1.0000 | MEDICATED_PATCH | TRANSDERMAL | 0 refills | Status: AC
  Filled 2023-12-17: qty 10, 30d supply, fill #0

## 2023-12-17 MED ORDER — CIPROFLOXACIN HCL 500 MG PO TABS
500.0000 mg | ORAL_TABLET | Freq: Two times a day (BID) | ORAL | 0 refills | Status: AC
  Filled 2023-12-17: qty 6, 3d supply, fill #0

## 2023-12-17 MED ORDER — FLUCONAZOLE 150 MG PO TABS
ORAL_TABLET | ORAL | 0 refills | Status: DC
  Filled 2023-12-17: qty 3, 4d supply, fill #0

## 2023-12-17 MED ORDER — AMPHETAMINE-DEXTROAMPHETAMINE 20 MG PO TABS
20.0000 mg | ORAL_TABLET | Freq: Two times a day (BID) | ORAL | 0 refills | Status: AC
  Filled 2023-12-17: qty 180, 90d supply, fill #0

## 2023-12-17 MED ORDER — ALPRAZOLAM 0.25 MG PO TABS
ORAL_TABLET | ORAL | 0 refills | Status: AC
  Filled 2023-12-17: qty 30, 30d supply, fill #0

## 2023-12-17 NOTE — Progress Notes (Signed)
 ..Virtual Visit via Video Note  I connected with [name] on 12/17/23 at  1:00 PM EST by a video enabled telemedicine application and verified that I am speaking with the correct person using two identifiers.  Location: Patient: work Provider: Clinic  .SABRAParticipating in visit:  Patient: [name] Provider: Vermell Bologna PA-C   I discussed the limitations of evaluation and management by telemedicine and the availability of in person appointments. The patient expressed understanding and agreed to proceed.  History of Present Illness: SABRASABRADiscussed the use of AI scribe software for clinical note transcription with the patient, who gave verbal consent to proceed.  History of Present Illness [name] is a 48 year old female who presents for medication refills and travel preparation.  Motion sickness and nausea - Significant motion sickness during travel - Finds Zofran  effective for nausea and requests a refill - Requests scopolamine  for motion sickness prevention during travel  Psychotropic medication use and adherence - Currently taking Wellbutrin , but has not refilled regularly due to excess supply from her daughter - Has discontinued sertraline  completely - Currently taking Adderall, needs refills   Anxiolytic use for travel - Considers taking Xanax  for flights - Has not refilled Xanax  in over a year  Travel-related infectious concerns - Requests ciprofloxacin for potential traveler's diarrhea prophylaxis - Requests fluconazole  (Diflucan ) for possible yeast infection due to anticipated prolonged swimsuit use during travel     Observations/Objective: No acute distress Normal mood and appearance Normal breathing    Assessment and Plan: SABRASABRAReeta Coffey was seen today for medication refill.  Diagnoses and all orders for this visit:  Counseling about travel -     scopolamine  (TRANSDERM-SCOP) 1 MG/3DAYS; Place 1 patch (1 mg total) onto the skin every 3  (three) days. -     ondansetron  (ZOFRAN -ODT) 8 MG disintegrating tablet; Dissolve 1 tablet under the tongue every 8 (eight) hours as needed for nausea. -     ALPRAZolam  (XANAX ) 0.25 MG tablet; Take one to two tablets as needed 15 minutes before flying. -     ciprofloxacin (CIPRO) 500 MG tablet; Take 1 tablet (500 mg total) by mouth 2 (two) times daily for 3 days. For travelers diarrhea. -     fluconazole  (DIFLUCAN ) 150 MG tablet; Take one tablet and repeat in 48 hours if symptoms persist.  Fear of flying -     ALPRAZolam  (XANAX ) 0.25 MG tablet; Take one to two tablets as needed 15 minutes before flying.  Motion sickness, subsequent encounter -     scopolamine  (TRANSDERM-SCOP) 1 MG/3DAYS; Place 1 patch (1 mg total) onto the skin every 3 (three) days. -     ondansetron  (ZOFRAN -ODT) 8 MG disintegrating tablet; Dissolve 1 tablet under the tongue every 8 (eight) hours as needed for nausea.  Attention deficit disorder (ADD) without hyperactivity   Assessment & Plan Motion sickness Experiences motion sickness during travel. - Prescribed scopolamine  for motion sickness.  Nausea and vomiting Experiences nausea and vomiting, particularly during travel. - Prescribed Zofran  for nausea and vomiting.  Depression, mild, partial remission Currently taking Wellbutrin . Discontinued sertraline .  Attention-deficit hyperactivity disorder Continues Adderall use. - Sent prescription for Adderall for Dr. Alvan to send 90 day.   Follow up in 6 months     Follow Up Instructions:    I discussed the assessment and treatment plan with the patient. The patient was provided an opportunity to ask questions and all were answered. The patient agreed with the plan and demonstrated an understanding of  the instructions.   The patient was advised to call back or seek an in-person evaluation if the symptoms worsen or if the condition fails to improve as anticipated.    Olivia Cahoon, PA-C

## 2023-12-18 ENCOUNTER — Other Ambulatory Visit (HOSPITAL_BASED_OUTPATIENT_CLINIC_OR_DEPARTMENT_OTHER): Payer: Self-pay

## 2023-12-18 MED ORDER — AZELASTINE-FLUTICASONE 137-50 MCG/ACT NA SUSP
1.0000 | Freq: Two times a day (BID) | NASAL | 1 refills | Status: AC
  Filled 2023-12-18: qty 23, 30d supply, fill #0

## 2023-12-19 ENCOUNTER — Other Ambulatory Visit (HOSPITAL_BASED_OUTPATIENT_CLINIC_OR_DEPARTMENT_OTHER): Payer: Self-pay

## 2023-12-19 ENCOUNTER — Other Ambulatory Visit (HOSPITAL_COMMUNITY): Payer: Self-pay

## 2023-12-19 ENCOUNTER — Telehealth: Payer: Self-pay

## 2023-12-19 NOTE — Telephone Encounter (Signed)
 Pharmacy Patient Advocate Encounter   Received notification from CoverMyMeds that prior authorization for Azelastine-Fluticasone 137-50mcg is required/requested.   Insurance verification completed.   The patient is insured through [facility]   Per test claim:  Step therapy drug, alternate drug therapy required prior to use of Azelastine-Fluticasone.   Trial of Fluticasone or Flunisolide in the past 120 days.

## 2023-12-20 ENCOUNTER — Other Ambulatory Visit (HOSPITAL_COMMUNITY): Payer: Self-pay

## 2023-12-20 NOTE — Telephone Encounter (Signed)
 Pharmacy Patient Advocate Encounter  Received notification from [facility] that Prior Authorization for Azelastine -Fluticasone  137-38mcg has been APPROVED from 12/20/23 to 12/18/24. Ran test claim, Copay is $5. This test claim was processed through [facility]- copay amounts may vary at other pharmacies due to pharmacy/plan contracts, or as the patient moves through the different stages of their insurance plan.   PA #/Case ID/Reference #: [id number]

## 2023-12-20 NOTE — Telephone Encounter (Signed)
 Pt failed fluticasone .

## 2023-12-20 NOTE — Telephone Encounter (Signed)
 Pharmacy Patient Advocate Encounter   Received notification from Pt Calls Messages that prior authorization for Azelastine -Fluticasone  137-56mcg is required/requested.   Insurance verification completed.   The patient is insured through [facility].   Per test claim: PA required; PA submitted to above mentioned insurance via Latent Key/confirmation #/EOC [id number] Status is pending

## 2023-12-23 ENCOUNTER — Other Ambulatory Visit: Payer: Self-pay | Admitting: Physician Assistant

## 2023-12-23 DIAGNOSIS — F3341 Major depressive disorder, recurrent, in partial remission: Secondary | ICD-10-CM

## 2023-12-23 DIAGNOSIS — F988 Other specified behavioral and emotional disorders with onset usually occurring in childhood and adolescence: Secondary | ICD-10-CM

## 2023-12-24 ENCOUNTER — Other Ambulatory Visit (HOSPITAL_BASED_OUTPATIENT_CLINIC_OR_DEPARTMENT_OTHER): Payer: Self-pay

## 2023-12-24 ENCOUNTER — Other Ambulatory Visit: Payer: Self-pay

## 2023-12-24 MED ORDER — BUPROPION HCL ER (XL) 300 MG PO TB24
300.0000 mg | ORAL_TABLET | Freq: Every day | ORAL | 1 refills | Status: AC
  Filled 2023-12-24 – 2024-01-14 (×2): qty 90, 90d supply, fill #0

## 2024-01-03 ENCOUNTER — Other Ambulatory Visit (HOSPITAL_BASED_OUTPATIENT_CLINIC_OR_DEPARTMENT_OTHER): Payer: Self-pay

## 2024-01-09 ENCOUNTER — Other Ambulatory Visit (HOSPITAL_BASED_OUTPATIENT_CLINIC_OR_DEPARTMENT_OTHER): Payer: Self-pay

## 2024-01-14 ENCOUNTER — Other Ambulatory Visit (HOSPITAL_BASED_OUTPATIENT_CLINIC_OR_DEPARTMENT_OTHER): Payer: Self-pay

## 2024-02-24 ENCOUNTER — Other Ambulatory Visit (HOSPITAL_BASED_OUTPATIENT_CLINIC_OR_DEPARTMENT_OTHER): Payer: Self-pay

## 2024-02-24 ENCOUNTER — Telehealth: Admitting: Physician Assistant

## 2024-02-24 DIAGNOSIS — M898X1 Other specified disorders of bone, shoulder: Secondary | ICD-10-CM

## 2024-02-24 MED ORDER — METHYLPREDNISOLONE 4 MG PO TBPK
ORAL_TABLET | ORAL | 0 refills | Status: DC
  Filled 2024-02-24: qty 21, 6d supply, fill #0

## 2024-02-24 MED ORDER — VALACYCLOVIR HCL 1 G PO TABS
1000.0000 mg | ORAL_TABLET | Freq: Three times a day (TID) | ORAL | 0 refills | Status: AC
  Filled 2024-02-24: qty 21, 7d supply, fill #0

## 2024-02-24 NOTE — Progress Notes (Signed)
 We are sorry that you are not feeling well.  Here is how we plan to help!  Based on what you have shared with me it, appears you may be experiencing acute back pain.   Acute back pain is defined as musculoskeletal pain that can resolve in 1-3 weeks with conservative treatment.  I have prescribed a steroid anti-inflammatory, Medrol  dose pack Take as directed. Continue your flexeril /cyclobenzaprine . Some patients experience stomach irritation or in increased heartburn with anti-inflammatory drugs.  Please keep in mind that muscle relaxer's can cause fatigue and should not be taken while at work or driving.  Back pain is very common.  The pain often gets better over time.  The cause of back pain is usually not dangerous.  Most people can learn to manage their back pain on their own.  I am going to add Valacyclovir  1000mg  Take 1 tablet 3 times daily for 7 days for possible shingles prodrome.   Home Care Stay active.  Start with short walks on flat ground if you can.  Try to walk farther each day. Do not sit, drive or stand in one place for more than 30 minutes.  Do not stay in bed. Do not fully avoid exercise or work.  Activity can help your back heal faster. Be careful when you bend or lift an object.  Bend at your knees, keep the object close to you, and do not twist. Sleep on a firm mattress.  Lie on your side, and bend your knees.  If you lie on your back, put a pillow under your knees. Only take medicines as told by your doctor. Put ice on the injured area. Put ice in a plastic bag Place a towel between your skin and the bag Leave the ice on for 15-20 minutes, 3-4 times a day for the first 2-3 days.  After that, you can switch between ice and heat packs. Ask your doctor about back exercises or massage.   Get Help Right Way If: Your pain does not go away with rest and treatments given today. Your pain does not go away within 1 week. You have new problems. You do not feel well. The pain  spreads into your legs. You cannot control when you poop (bowel movement) or pee (urinate). You feel sick to your stomach (nauseous) or throw up (vomit). You have belly (abdominal) pain. You feel like you may pass out (faint). If you develop a fever.  Make Sure you: Understand these instructions. Continue to monitor your condition for any changes. Will get help right away if you are not doing well or get worse.  Your e-visit answers were reviewed by a board certified advanced clinical practitioner to complete your personal care plan.  Depending on the condition, your plan could have included both over the counter or prescription medications.  If there is a problem, please reply once you have received a response from your provider.  Your safety is important to us .  If you have drug allergies, check your prescription carefully.    You can use MyChart to ask questions about todays visit, request a non-urgent call back, or ask for a work or school excuse for 24 hours related to this e-Visit. If it has been greater than 24 hours you will need to follow up with your provider or enter a new e-Visit to address those concerns.  You will get an e-mail in the next two days asking about your experience.  I hope that your e-visit has been  valuable and will speed your recovery. Thank you for using e-visits.   I have spent 5 minutes in review of e-visit questionnaire, review and updating patient chart, medical decision making and response to patient.   Delon CHRISTELLA Dickinson, PA-C

## 2024-02-25 ENCOUNTER — Other Ambulatory Visit (HOSPITAL_COMMUNITY): Payer: Self-pay

## 2024-02-25 ENCOUNTER — Other Ambulatory Visit (HOSPITAL_BASED_OUTPATIENT_CLINIC_OR_DEPARTMENT_OTHER): Payer: Self-pay

## 2024-02-25 ENCOUNTER — Telehealth: Admitting: Physician Assistant

## 2024-02-25 ENCOUNTER — Encounter: Payer: Self-pay | Admitting: Physician Assistant

## 2024-02-25 DIAGNOSIS — B029 Zoster without complications: Secondary | ICD-10-CM | POA: Diagnosis not present

## 2024-02-25 DIAGNOSIS — M898X1 Other specified disorders of bone, shoulder: Secondary | ICD-10-CM

## 2024-02-25 MED ORDER — PREDNISONE 20 MG PO TABS
ORAL_TABLET | ORAL | 0 refills | Status: AC
  Filled 2024-02-25: qty 20, 13d supply, fill #0

## 2024-02-25 MED ORDER — GABAPENTIN 100 MG PO CAPS
ORAL_CAPSULE | ORAL | 0 refills | Status: AC
  Filled 2024-02-25: qty 100, 20d supply, fill #0

## 2024-02-25 NOTE — Progress Notes (Signed)
..  Virtual Visit via Video Note  I connected with [name] on 02/25/24 at  2:40 PM EST by a video enabled telemedicine application and verified that I am speaking with the correct person using two identifiers.  Location: Patient: work Provider: clinic  .Participating in visit:  Patient: [name] Provider: Vermell Bologna PA-C Provider in training: Annitta Raw PA-S   I discussed the limitations of evaluation and management by telemedicine and the availability of in person appointments. The patient expressed understanding and agreed to proceed.  History of Present Illness: Patient is a 49 yo female who calls into the clinic to follow up on shingles and associated pain. She had virtual visit where she was dx with shingles and given medrol  dose pack and valtrex . Rash starts just under right shoulder blade and goes into right breast. She does not feel like medrol  dose pack is a strong enough mg of steroid. It is helping very minimally. No known trigger but she has been battling a URI for last 2-3 weeks. Pain is severe. She feels like she has lasers shooting out of her nipple. She cannot wear a bra. No fever, chills. Lidocaine  gel is helping some.     Observations/Objective: No acute distress Erythematous vesicular rash from right shoulder blade into right breast   Assessment and Plan: .Diagnoses and all orders for this visit:  Herpes zoster without complication -     predniSONE  (DELTASONE ) 20 MG tablet; Take 3 tablets daily for 3 days, take 2 tablets daily for 3 days, take 1 tablet daily for 3 days, take 1/2 tablet daily for 4 days. -     gabapentin  (NEURONTIN ) 100 MG capsule; Take one tablet in the morning and one tablet in afternoon and then 1-3 tablets at bedtime for pain associated with shingles.  Shoulder blade pain -     predniSONE  (DELTASONE ) 20 MG tablet; Take 3 tablets daily for 3 days, take 2 tablets daily for 3 days, take 1 tablet daily for 3 days, take 1/2 tablet daily for  4 days. -     gabapentin  (NEURONTIN ) 100 MG capsule; Take one tablet in the morning and one tablet in afternoon and then 1-3 tablets at bedtime for pain associated with shingles.   Continue valtrex .  Stop medrol  dose pack and started prednisone  taper given.  Trial of gabapentin  up to three times a day Continue lidocaine  and consider using patches that last longer instead of creams.  Ok to continue tylenol . Consider tramadol as needed if pain not improving.  Discussed shingles vaccine Avalos before 50 in the next 6 months.    Follow Up Instructions:    I discussed the assessment and treatment plan with the patient. The patient was provided an opportunity to ask questions and all were answered. The patient agreed with the plan and demonstrated an understanding of the instructions.   The patient was advised to call back or seek an in-person evaluation if the symptoms worsen or if the condition fails to improve as anticipated.  I provided 15 minutes of non-face-to-face time during this encounter.   Dallon Dacosta, PA-C

## 2024-03-04 ENCOUNTER — Ambulatory Visit: Admitting: Dermatology

## 2024-03-04 ENCOUNTER — Encounter: Payer: Self-pay | Admitting: Dermatology

## 2024-03-04 VITALS — BP 130/83

## 2024-03-04 DIAGNOSIS — L578 Other skin changes due to chronic exposure to nonionizing radiation: Secondary | ICD-10-CM

## 2024-03-04 DIAGNOSIS — L814 Other melanin hyperpigmentation: Secondary | ICD-10-CM

## 2024-03-04 DIAGNOSIS — Z1283 Encounter for screening for malignant neoplasm of skin: Secondary | ICD-10-CM

## 2024-03-04 DIAGNOSIS — D225 Melanocytic nevi of trunk: Secondary | ICD-10-CM

## 2024-03-04 DIAGNOSIS — D1801 Hemangioma of skin and subcutaneous tissue: Secondary | ICD-10-CM

## 2024-03-04 DIAGNOSIS — D229 Melanocytic nevi, unspecified: Secondary | ICD-10-CM

## 2024-03-04 DIAGNOSIS — L821 Other seborrheic keratosis: Secondary | ICD-10-CM

## 2024-03-04 DIAGNOSIS — D485 Neoplasm of uncertain behavior of skin: Secondary | ICD-10-CM

## 2024-03-04 DIAGNOSIS — W908XXA Exposure to other nonionizing radiation, initial encounter: Secondary | ICD-10-CM

## 2024-03-04 NOTE — Progress Notes (Signed)
" ° °  Total Body Skin Exam (TBSE) Visit   Subjective  [name] is a 49 y.o. female ESTABLISHED PATIENT who presents for the following:  Total Body Skin Exam (TBSE)  Patient was last evaluated 10/03/23 for pityriasis rosea.  Patient does not have spots of concern to be evaluated - she mentioned that she currently has shingles that is resolving. She does apply sunscreen and/or wears protective coverings. Reports Hx of Bx - benign. Reports family Hx of skin cancers (maternal grandmother-BCC, paternal grandmother-BCC, mother-BCC, maternal uncle - melanoma).   The patient has spots, moles and lesions to be evaluated, some may be new or changing and the patient has concerns that these could be cancer.  The following portions of the chart were reviewed this encounter and updated as appropriate: medications, allergies, medical history  Review of Systems:  No other skin or systemic complaints except as noted in HPI or Assessment and Plan.  Objective  Well appearing patient in no apparent distress; mood and affect are within normal limits.  A full examination was performed including scalp, head, eyes, ears, nose, lips, neck, chest, axillae, abdomen, back, buttocks, bilateral upper extremities, bilateral lower extremities, hands, feet, fingers, toes, fingernails, and toenails. All findings within normal limits unless otherwise noted below.   Relevant physical exam findings are noted in the Assessment and Plan.         Assessment & Plan   LENTIGINES, SEBORRHEIC KERATOSES, HEMANGIOMAS - Benign normal skin lesions - Benign-appearing - Call for any changes  BENIGN MELANOCYTIC NEVI - Tan-brown and/or pink-flesh-colored symmetric macules and papules - Benign appearing on exam today - Observation - Call clinic for new or changing moles - Recommend daily use of broad spectrum spf 30+ sunscreen to sun-exposed areas.   MILD ACTINIC DAMAGE - Chronic condition, secondary to cumulative UV/sun  exposure - diffuse scaly erythematous macules with underlying dyspigmentation - Recommend daily broad spectrum sunscreen SPF 30+ to sun-exposed areas, reapply every 2 hours as needed.  - Staying in the shade or wearing long sleeves, sun glasses (UVA+UVB protection) and wide brim hats (4-inch brim around the entire circumference of the hat) are also recommended for sun protection.  - Call for new or changing lesions.   SKIN CANCER SCREENING PERFORMED TODAY.  Return in about 1 year (around 03/04/2025) for TBSE.   Documentation: I have reviewed the above documentation for accuracy and completeness, and I agree with the above.  I, Shirron Maranda, CMA II, am acting as scribe for:  Delon Lenis, DO "

## 2024-03-04 NOTE — Patient Instructions (Signed)

## 2024-03-05 LAB — SURGICAL PATHOLOGY

## 2024-03-06 ENCOUNTER — Ambulatory Visit: Payer: Self-pay | Admitting: Dermatology

## 2024-03-13 ENCOUNTER — Encounter: Payer: Commercial Managed Care - PPO | Admitting: Physician Assistant

## 2024-04-03 ENCOUNTER — Encounter: Admitting: Physician Assistant
